# Patient Record
Sex: Female | Born: 1995 | Race: White | Hispanic: No | Marital: Single | Smoking: Former smoker
Health system: Southern US, Community
[De-identification: ages and names within clinical notes are randomized; demographics above are authoritative.]

## PROBLEM LIST (undated history)

## (undated) DIAGNOSIS — D6801 Von willebrand disease, type 1: Secondary | ICD-10-CM

## (undated) DIAGNOSIS — E039 Hypothyroidism, unspecified: Secondary | ICD-10-CM

## (undated) DIAGNOSIS — E109 Type 1 diabetes mellitus without complications: Secondary | ICD-10-CM

## (undated) DIAGNOSIS — D680 Von Willebrand's disease: Secondary | ICD-10-CM

## (undated) DIAGNOSIS — E119 Type 2 diabetes mellitus without complications: Secondary | ICD-10-CM

## (undated) HISTORY — PX: INCISION AND DRAINAGE FOOT: SHX1800

## (undated) HISTORY — PX: TONSILLECTOMY: SUR1361

---
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
## 2019-08-11 ENCOUNTER — Encounter (HOSPITAL_COMMUNITY): Payer: Self-pay | Admitting: *Deleted

## 2019-08-11 ENCOUNTER — Emergency Department (HOSPITAL_COMMUNITY)
Admission: EM | Admit: 2019-08-11 | Discharge: 2019-08-11 | Disposition: A | Discharge status: Home / Self Care | Payer: Self-pay | Attending: Emergency Medicine | Admitting: Emergency Medicine

## 2019-08-11 ENCOUNTER — Emergency Department (HOSPITAL_COMMUNITY): Payer: Self-pay

## 2019-08-11 ENCOUNTER — Other Ambulatory Visit: Payer: Self-pay

## 2019-08-11 DIAGNOSIS — D680 Von Willebrand's disease: Secondary | ICD-10-CM | POA: Insufficient documentation

## 2019-08-11 DIAGNOSIS — E1065 Type 1 diabetes mellitus with hyperglycemia: Secondary | ICD-10-CM | POA: Insufficient documentation

## 2019-08-11 DIAGNOSIS — R509 Fever, unspecified: Secondary | ICD-10-CM

## 2019-08-11 DIAGNOSIS — Z20828 Contact with and (suspected) exposure to other viral communicable diseases: Secondary | ICD-10-CM | POA: Insufficient documentation

## 2019-08-11 DIAGNOSIS — Z79899 Other long term (current) drug therapy: Secondary | ICD-10-CM | POA: Insufficient documentation

## 2019-08-11 DIAGNOSIS — R739 Hyperglycemia, unspecified: Secondary | ICD-10-CM

## 2019-08-11 DIAGNOSIS — E039 Hypothyroidism, unspecified: Secondary | ICD-10-CM | POA: Insufficient documentation

## 2019-08-11 LAB — COMPREHENSIVE METABOLIC PANEL
ALT: 20 U/L (ref 0–44)
AST: 19 U/L (ref 15–41)
Albumin: 4.2 g/dL (ref 3.5–5.0)
Alkaline Phosphatase: 108 U/L (ref 38–126)
Anion gap: 13 (ref 5–15)
BUN: 11 mg/dL (ref 6–20)
CO2: 24 mmol/L (ref 22–32)
Calcium: 9.3 mg/dL (ref 8.9–10.3)
Chloride: 98 mmol/L (ref 98–111)
Creatinine, Ser: 0.6 mg/dL (ref 0.44–1.00)
GFR calc Af Amer: >60 mL/min (ref >60)
GFR calc non Af Amer: >60 mL/min (ref >60)
Glucose, Bld: 347 mg/dL — ABNORMAL HIGH (ref 70–99)
Potassium: 3.7 mmol/L (ref 3.5–5.1)
Sodium: 135 mmol/L (ref 135–145)
Total Bilirubin: 0.6 mg/dL (ref 0.3–1.2)
Total Protein: 7.8 g/dL (ref 6.5–8.1)

## 2019-08-11 LAB — CBC WITH DIFFERENTIAL/PLATELET
Abs Immature Granulocytes: 0.07 10*3/uL (ref 0.00–0.07)
Basophils Absolute: 0.1 10*3/uL (ref 0.0–0.1)
Basophils Relative: 1 %
Eosinophils Absolute: 0.1 10*3/uL (ref 0.0–0.5)
Eosinophils Relative: 1 %
HCT: 47.4 % — ABNORMAL HIGH (ref 36.0–46.0)
Hemoglobin: 15.5 g/dL — ABNORMAL HIGH (ref 12.0–15.0)
Immature Granulocytes: 1 %
Lymphocytes Relative: 12 %
Lymphs Abs: 1.2 10*3/uL (ref 0.7–4.0)
MCH: 30.8 pg (ref 26.0–34.0)
MCHC: 32.7 g/dL (ref 30.0–36.0)
MCV: 94.2 fL (ref 80.0–100.0)
Monocytes Absolute: 0.9 10*3/uL (ref 0.1–1.0)
Monocytes Relative: 9 %
Neutro Abs: 7.5 10*3/uL (ref 1.7–7.7)
Neutrophils Relative %: 76 %
Platelets: 202 10*3/uL (ref 150–400)
RBC: 5.03 MIL/uL (ref 3.87–5.11)
RDW: 12.4 % (ref 11.5–15.5)
WBC: 9.7 10*3/uL (ref 4.0–10.5)
nRBC: 0 % (ref 0.0–0.2)

## 2019-08-11 LAB — URINALYSIS, ROUTINE W REFLEX MICROSCOPIC
Bacteria, UA: NONE SEEN
Bilirubin Urine: NEGATIVE
Glucose, UA: >=500 mg/dL — AB
Ketones, ur: 20 mg/dL — AB
Leukocytes,Ua: NEGATIVE
Nitrite: NEGATIVE
Protein, ur: NEGATIVE mg/dL
Specific Gravity, Urine: 1.031 — ABNORMAL HIGH (ref 1.005–1.030)
pH: 6 (ref 5.0–8.0)

## 2019-08-11 LAB — CBG MONITORING, ED: Glucose-Capillary: 286 mg/dL — ABNORMAL HIGH (ref 70–99)

## 2019-08-11 LAB — PREGNANCY, URINE: Preg Test, Ur: NEGATIVE

## 2019-08-11 LAB — LIPASE, BLOOD: Lipase: 15 U/L (ref 11–51)

## 2019-08-11 MED ORDER — INSULIN ASPART 100 UNIT/ML ~~LOC~~ SOLN
6.0000 [IU] | Freq: Once | SUBCUTANEOUS | Status: AC
  Administered 2019-08-11: 6 [IU] via SUBCUTANEOUS
  Filled 2019-08-11: qty 1

## 2019-08-11 MED ORDER — SODIUM CHLORIDE 0.9 % IV BOLUS (SEPSIS)
1000.0000 mL | Freq: Once | INTRAVENOUS | Status: AC
  Administered 2019-08-11: 20:00:00 1000 mL via INTRAVENOUS

## 2019-08-11 MED ORDER — SODIUM CHLORIDE 0.9 % IV BOLUS (SEPSIS): 1000.0000 mL | Freq: Once | INTRAVENOUS | Status: DC

## 2019-08-11 NOTE — Discharge Instructions (Signed)
Drink plenty of fluids and take your insulin as prescribed. Thank you for allowing me to care for you today. Please return to the emergency department if you have new or worsening symptoms. Take your medications as instructed.

## 2019-08-11 NOTE — ED Provider Notes (Signed)
[facility] Provider Note   CSN: [id number] Arrival date & time: 08/11/19  1901     History   Chief Complaint Chief Complaint  Patient presents with  . Fever    HPI Tammy Higgins is a 23 y.o. female.     Patient is a 23 year old female with past medical history of type 1 diabetes, hypothyroidism, von Willebrand's disease who presents emergency department for fever.  Patient reports that she began feeling bad yesterday.  Reports a fever, congestion, some upper abdominal pain.  Reports feeling nauseated and not being able to eat very much.  Reports one episode of vomiting a couple of days ago but she attributes this to drinking alcohol.  Denies any diarrhea, dysuria, cough, sick contacts.  She did take Tylenol about 1 hour ago.  Reports feeling slight headache as well.     Past Medical History:  Diagnosis Date  . Diabetes mellitus without complication (HCC)   . Hypothyroidism   . Von Willebrand disease, type I (HCC)     There are no active problems to display for this patient.   Past Surgical History:  Procedure Laterality Date  . INCISION AND DRAINAGE FOOT Left   . TONSILLECTOMY       OB History   No obstetric history on file.      Home Medications    Prior to Admission medications   Medication Sig Start Date End Date Taking? Authorizing Provider  acetaminophen (TYLENOL) 500 MG tablet Take 1,000 mg by mouth every 6 (six) hours as needed.    [provider]  insulin aspart (NOVOLOG) 100 UNIT/ML injection Inject 1-8 Units into the skin 3 (three) times daily before meals. Uses according to a sliding scale at home.    [provider]  insulin glargine (LANTUS) 100 UNIT/ML injection Inject 30 Units into the skin at bedtime.    [provider]  ondansetron (ZOFRAN) 4 MG tablet Take 1 tablet (4 mg total) by mouth every 6 (six) hours. 11/21/13   Ivery QualeBryant, Hobson, PA-C  oseltamivir (TAMIFLU) 75 MG capsule Take 1 capsule (75 mg  total) by mouth every 12 (twelve) hours. 11/21/13   Ivery QualeBryant, Hobson, PA-C  PRESCRIPTION MEDICATION Take 1 tablet by mouth daily. Birth Control    [provider]    Family History History reviewed. No pertinent family history.  Social History Social History   Tobacco Use  . Smoking status: Never Smoker  . Smokeless tobacco: Never Used  Substance Use Topics  . Alcohol use: Yes    Comment: occasionally  . Drug use: Yes    Types: Marijuana     Allergies   Patient has no known allergies.   Review of Systems Review of Systems  Constitutional: Positive for fatigue and fever. Negative for activity change, appetite change, chills, diaphoresis and unexpected weight change.  HENT: Positive for congestion. Negative for rhinorrhea, sinus pain, sore throat and trouble swallowing.   Eyes: Positive for redness. Negative for photophobia, pain, discharge, itching and visual disturbance.  Respiratory: Negative for cough and shortness of breath.   Cardiovascular: Negative for chest pain, palpitations and leg swelling.  Gastrointestinal: Positive for nausea and vomiting. Negative for abdominal distention, abdominal pain, blood in stool, constipation and diarrhea.  Genitourinary: Negative for decreased urine volume, dysuria, flank pain, hematuria and vaginal discharge.  Musculoskeletal: Negative for arthralgias, back pain, myalgias, neck pain and neck stiffness.  Skin: Negative for rash.  Neurological: Positive for headaches. Negative for dizziness, weakness and light-headedness.  Physical Exam Updated Vital Signs BP 127/85 (BP Location: Right Arm)   Pulse (!) 121   Temp 98.7 F (37.1 C) (Oral)   Resp 18   Ht 5\' 3"  (1.6 m)   Wt 49.9 kg   LMP 08/11/2019   SpO2 98%   BMI 19.49 kg/m   Physical Exam Vitals signs and nursing note reviewed.  Constitutional:      Appearance: Normal appearance.  HENT:     Head: Normocephalic and atraumatic.     Mouth/Throat:     Mouth: Mucous  membranes are moist.  Eyes:     Pupils: Pupils are equal, round, and reactive to light.     Comments: Mildly injected conjunctiva bilaterally  Cardiovascular:     Rate and Rhythm: Regular rhythm. Tachycardia present.  Pulmonary:     Effort: Pulmonary effort is normal. No respiratory distress.     Breath sounds: Normal breath sounds. No wheezing, rhonchi or rales.  Chest:     Chest wall: No tenderness.  Abdominal:     General: Abdomen is flat. Bowel sounds are normal. There is no distension.     Palpations: Abdomen is soft.     Tenderness: There is no right CVA tenderness, left CVA tenderness or guarding.  Musculoskeletal: Normal range of motion.  Skin:    General: Skin is warm and dry.  Neurological:     General: No focal deficit present.     Mental Status: She is alert.  Psychiatric:        Mood and Affect: Mood normal.      ED Treatments / Results  Labs (all labs ordered are listed, but only abnormal results are displayed) Labs Reviewed  COMPREHENSIVE METABOLIC PANEL - Abnormal; Notable for the following components:      Result Value   Glucose, Bld 347 (*)    All other components within normal limits  CBC WITH DIFFERENTIAL/PLATELET - Abnormal; Notable for the following components:   Hemoglobin 15.5 (*)    HCT 47.4 (*)    All other components within normal limits  URINALYSIS, ROUTINE W REFLEX MICROSCOPIC - Abnormal; Notable for the following components:   Specific Gravity, Urine 1.031 (*)    Glucose, UA >=500 (*)    Hgb urine dipstick LARGE (*)    Ketones, ur 20 (*)    All other components within normal limits  CBG MONITORING, ED - Abnormal; Notable for the following components:   Glucose-Capillary 286 (*)    All other components within normal limits  NOVEL CORONAVIRUS, NAA (HOSP ORDER, SEND-OUT TO REF LAB; TAT 18-24 HRS)  LIPASE, BLOOD  PREGNANCY, URINE    EKG None  Radiology Dg Chest Port 1 View  Result Date: 08/11/2019 CLINICAL DATA:  Cough and fever  EXAM: PORTABLE CHEST 1 VIEW COMPARISON:  October 05, 2018 FINDINGS: There is no edema or consolidation. Heart size and pulmonary vascularity are normal. No adenopathy. No bone lesions. IMPRESSION: No edema or consolidation. Electronically Signed   By: Lowella Grip III M.D.   On: 08/11/2019 20:31    Procedures Procedures (including critical care time)  Medications Ordered in ED Medications  insulin aspart (novoLOG) injection 6 Units (has no administration in time range)  sodium chloride 0.9 % bolus 1,000 mL (has no administration in time range)  sodium chloride 0.9 % bolus 1,000 mL (1,000 mLs Intravenous New Bag/Given 08/11/19 2018)     Initial Impression / Assessment and Plan / ED Course  I have reviewed the triage vital signs and  the nursing notes.  Pertinent labs & imaging results that were available during my care of the patient were reviewed by me and considered in my medical decision making (see chart for details).  Clinical Course as of Aug 10 2149  Mon Aug 11, 2019  2110 Type I diabetic presenting with 2 days of anorexia, fever, congestion.  Has not taken her insulin today.  Denies any nausea, vomiting, abdominal pain, dysuria.  On work-up she appears to be dehydrated with large amounts of glucose and 20 ketones in her urine. Hgb urine Is explained by her current menstrual cycle. Anion gap is normal.  The remainder of her labs are reassuring and she was afebrile here today.  Does not appear to be in DKA.  Her blood sugar was elevated to 347 and therefore 2 L of fluid and 6 units of NovoLog are given.  Patient is feeling much better after 1 L of fluids and her glucose was decreased to 286.  She declined any insulin or further fluids.  Tolerating p.o. and asks to go home.  I will treat this as suspected COVID and order a send out COVID swab.  Advised on quarantine at home and return precautions.   [KM]    Clinical Course User Index [KM] Arlyn Dunning, PA-C       Based on  review of vitals, medical screening exam, lab work and/or imaging, there does not appear to be an acute, emergent etiology for the patient's symptoms. Counseled pt on good return precautions and encouraged both PCP and ED follow-up as needed.  Prior to discharge, I also discussed incidental imaging findings with patient in detail and advised appropriate, recommended follow-up in detail.  Clinical Impression: 1. Hyperglycemia   2. Fever   3. Fever, unspecified fever cause     Disposition: Discharge  Prior to providing a prescription for a controlled substance, I independently reviewed the patient's recent prescription history on the [place] Controlled Substance Reporting System. The patient had no recent or regular prescriptions and was deemed appropriate for a brief, less than 3 day prescription of narcotic for acute analgesia.  This note was prepared with assistance of Conservation officer, historic buildings. Occasional wrong-word or sound-a-like substitutions may have occurred due to the inherent limitations of voice recognition software.   Final Clinical Impressions(s) / ED Diagnoses   Final diagnoses:  Hyperglycemia  Fever, unspecified fever cause    ED Discharge Orders    None       Jeral Pinch 08/11/19 2150    Bethann Berkshire, MD 08/14/19 1049

## 2019-08-11 NOTE — ED Triage Notes (Signed)
Pt states she started running a fever of 104 today and has been feeling sob and unable to keep any food or fluids down; pt took tylenol one hour pta

## 2019-08-13 LAB — NOVEL CORONAVIRUS, NAA (HOSP ORDER, SEND-OUT TO REF LAB; TAT 18-24 HRS): SARS-CoV-2, NAA: NOT DETECTED

## 2020-02-27 ENCOUNTER — Emergency Department (HOSPITAL_COMMUNITY)
Admission: EM | Admit: 2020-02-27 | Discharge: 2020-02-27 | Disposition: A | Discharge status: Home / Self Care | Payer: Medicaid Other | Attending: Emergency Medicine | Admitting: Emergency Medicine

## 2020-02-27 ENCOUNTER — Encounter (HOSPITAL_COMMUNITY): Payer: Self-pay | Admitting: *Deleted

## 2020-02-27 ENCOUNTER — Other Ambulatory Visit: Payer: Self-pay

## 2020-02-27 DIAGNOSIS — E1065 Type 1 diabetes mellitus with hyperglycemia: Secondary | ICD-10-CM | POA: Insufficient documentation

## 2020-02-27 DIAGNOSIS — K529 Noninfective gastroenteritis and colitis, unspecified: Secondary | ICD-10-CM | POA: Insufficient documentation

## 2020-02-27 DIAGNOSIS — R739 Hyperglycemia, unspecified: Secondary | ICD-10-CM

## 2020-02-27 DIAGNOSIS — A084 Viral intestinal infection, unspecified: Secondary | ICD-10-CM

## 2020-02-27 DIAGNOSIS — Z793 Long term (current) use of hormonal contraceptives: Secondary | ICD-10-CM | POA: Insufficient documentation

## 2020-02-27 DIAGNOSIS — R112 Nausea with vomiting, unspecified: Secondary | ICD-10-CM

## 2020-02-27 DIAGNOSIS — Z79899 Other long term (current) drug therapy: Secondary | ICD-10-CM | POA: Insufficient documentation

## 2020-02-27 DIAGNOSIS — N3001 Acute cystitis with hematuria: Secondary | ICD-10-CM

## 2020-02-27 DIAGNOSIS — Z20822 Contact with and (suspected) exposure to covid-19: Secondary | ICD-10-CM | POA: Insufficient documentation

## 2020-02-27 DIAGNOSIS — R197 Diarrhea, unspecified: Secondary | ICD-10-CM

## 2020-02-27 DIAGNOSIS — E039 Hypothyroidism, unspecified: Secondary | ICD-10-CM | POA: Insufficient documentation

## 2020-02-27 LAB — COMPREHENSIVE METABOLIC PANEL
ALT: 21 U/L (ref 0–44)
AST: 24 U/L (ref 15–41)
Albumin: 4.5 g/dL (ref 3.5–5.0)
Alkaline Phosphatase: 80 U/L (ref 38–126)
Anion gap: 12 (ref 5–15)
BUN: 14 mg/dL (ref 6–20)
CO2: 25 mmol/L (ref 22–32)
Calcium: 9.4 mg/dL (ref 8.9–10.3)
Chloride: 101 mmol/L (ref 98–111)
Creatinine, Ser: 0.65 mg/dL (ref 0.44–1.00)
GFR calc Af Amer: >60 mL/min (ref >60)
GFR calc non Af Amer: >60 mL/min (ref >60)
Glucose, Bld: 274 mg/dL — ABNORMAL HIGH (ref 70–99)
Potassium: 3.4 mmol/L — ABNORMAL LOW (ref 3.5–5.1)
Sodium: 138 mmol/L (ref 135–145)
Total Bilirubin: 0.5 mg/dL (ref 0.3–1.2)
Total Protein: 7.7 g/dL (ref 6.5–8.1)

## 2020-02-27 LAB — URINALYSIS, ROUTINE W REFLEX MICROSCOPIC
Bilirubin Urine: NEGATIVE
Glucose, UA: >=500 mg/dL — AB
Ketones, ur: NEGATIVE mg/dL
Nitrite: NEGATIVE
Protein, ur: 30 mg/dL — AB
RBC / HPF: >50 RBC/hpf — ABNORMAL HIGH (ref 0–5)
Specific Gravity, Urine: 1.015 (ref 1.005–1.030)
pH: 8 (ref 5.0–8.0)

## 2020-02-27 LAB — CBC WITH DIFFERENTIAL/PLATELET
Abs Immature Granulocytes: 0.07 10*3/uL (ref 0.00–0.07)
Basophils Absolute: 0.1 10*3/uL (ref 0.0–0.1)
Basophils Relative: 0 %
Eosinophils Absolute: 0 10*3/uL (ref 0.0–0.5)
Eosinophils Relative: 0 %
HCT: 46.5 % — ABNORMAL HIGH (ref 36.0–46.0)
Hemoglobin: 15.5 g/dL — ABNORMAL HIGH (ref 12.0–15.0)
Immature Granulocytes: 0 %
Lymphocytes Relative: 8 %
Lymphs Abs: 1.5 10*3/uL (ref 0.7–4.0)
MCH: 30.8 pg (ref 26.0–34.0)
MCHC: 33.3 g/dL (ref 30.0–36.0)
MCV: 92.3 fL (ref 80.0–100.0)
Monocytes Absolute: 1 10*3/uL (ref 0.1–1.0)
Monocytes Relative: 6 %
Neutro Abs: 14.8 10*3/uL — ABNORMAL HIGH (ref 1.7–7.7)
Neutrophils Relative %: 86 %
Platelets: 252 10*3/uL (ref 150–400)
RBC: 5.04 MIL/uL (ref 3.87–5.11)
RDW: 12.3 % (ref 11.5–15.5)
WBC: 17.4 10*3/uL — ABNORMAL HIGH (ref 4.0–10.5)
nRBC: 0 % (ref 0.0–0.2)

## 2020-02-27 LAB — RESPIRATORY PANEL BY RT PCR (FLU A&B, COVID)
Influenza A by PCR: NEGATIVE
Influenza B by PCR: NEGATIVE
SARS Coronavirus 2 by RT PCR: NEGATIVE

## 2020-02-27 LAB — CBG MONITORING, ED
Glucose-Capillary: 254 mg/dL — ABNORMAL HIGH (ref 70–99)
Glucose-Capillary: 140 mg/dL — ABNORMAL HIGH (ref 70–99)

## 2020-02-27 LAB — HCG, SERUM, QUALITATIVE: Preg, Serum: NEGATIVE

## 2020-02-27 LAB — TSH: TSH: 3.446 u[IU]/mL (ref 0.350–4.500)

## 2020-02-27 LAB — BETA-HYDROXYBUTYRIC ACID: Beta-Hydroxybutyric Acid: 0.15 mmol/L (ref 0.05–0.27)

## 2020-02-27 LAB — LIPASE, BLOOD: Lipase: 16 U/L (ref 11–51)

## 2020-02-27 MED ORDER — ONDANSETRON HCL 4 MG/2ML IJ SOLN
4.0000 mg | Freq: Once | INTRAMUSCULAR | Status: AC
  Administered 2020-02-27: 15:00:00 4 mg via INTRAVENOUS
  Filled 2020-02-27: qty 2

## 2020-02-27 MED ORDER — PROMETHAZINE HCL 25 MG/ML IJ SOLN
12.5000 mg | Freq: Once | INTRAMUSCULAR | Status: AC
  Administered 2020-02-27: 17:00:00 12.5 mg via INTRAVENOUS
  Filled 2020-02-27: qty 1

## 2020-02-27 MED ORDER — SODIUM CHLORIDE 0.9 % IV BOLUS
1000.0000 mL | Freq: Once | INTRAVENOUS | Status: AC
  Administered 2020-02-27: 15:00:00 1000 mL via INTRAVENOUS

## 2020-02-27 MED ORDER — LACTATED RINGERS IV BOLUS
1000.0000 mL | Freq: Once | INTRAVENOUS | Status: AC
  Administered 2020-02-27: 17:00:00 1000 mL via INTRAVENOUS

## 2020-02-27 MED ORDER — CEPHALEXIN 500 MG PO CAPS: 500.0000 mg | ORAL_CAPSULE | Freq: Two times a day (BID) | ORAL | 0 refills | Status: AC

## 2020-02-27 MED ORDER — PROMETHAZINE HCL 25 MG PO TABS: 25.0000 mg | ORAL_TABLET | Freq: Three times a day (TID) | ORAL | 0 refills | Status: DC | PRN

## 2020-02-27 NOTE — ED Provider Notes (Signed)
Care handoff received from [facility] PA-C at shift change. Please see previous note for full details.  In short 24 year old type I diabetic presents today with nausea vomiting and diarrhea that began this morning.  Labs obtained by previous team are very reassuring, patient does not appear to be in DKA.  She was given normal saline bolus as well as lactated Ringer's as well as 4 mg IV Zofran x2.  Plan of care at shift change is to follow-up on remaining labs, p.o. challenge and anticipate discharge. Physical Exam  BP 115/77 (BP Location: Right Arm)   Pulse 90   Temp 97.6 F (36.4 C)   Resp 18   Ht 5\' 2"  (1.575 m)   Wt 54.4 kg   LMP 02/27/2020   SpO2 99%   BMI 21.95 kg/m   Physical Exam Constitutional:      General: She is not in acute distress.    Appearance: Normal appearance. She is well-developed. She is not ill-appearing or diaphoretic.  HENT:     Head: Normocephalic and atraumatic.     Right Ear: External ear normal.     Left Ear: External ear normal.     Nose: Nose normal.  Eyes:     General: Vision grossly intact. Gaze aligned appropriately.     Pupils: Pupils are equal, round, and reactive to light.  Neck:     Trachea: Trachea and phonation normal. No tracheal deviation.  Pulmonary:     Effort: Pulmonary effort is normal. No respiratory distress.  Abdominal:     General: There is no distension.     Palpations: Abdomen is soft.     Tenderness: There is no abdominal tenderness. There is no guarding or rebound.  Musculoskeletal:        General: Normal range of motion.     Cervical back: Normal range of motion.  Skin:    General: Skin is warm and dry.  Neurological:     Mental Status: She is alert.     GCS: GCS eye subscore is 4. GCS verbal subscore is 5. GCS motor subscore is 6.     Comments: Speech is clear and goal oriented, follows commands Major Cranial nerves without deficit, no facial droop Moves extremities without ataxia, coordination intact   Psychiatric:        Behavior: Behavior normal.     ED Course/Procedures   Clinical Course as of Feb 26 1725  Fri Feb 27, 2020  1419 Lipase within normal limits  Lipase, blood [WF]  1509 Very mild hypokalemia at 3.4 likely secondary to GI losses.  Glucose 274 consistent with mild DKA.  Comprehensive metabolic panel(!) [WF]  1509 CBC notable for leukocytosis likely secondary to demyelinization secondary to vomiting.  Patient does appear to be dehydrated with hemoconcentration and elevated hemoglobin.  She is currently on her period low suspicion for.  To be symptomatically anemic given her elevated/normal hemoglobin.  CBC with Differential/Platelet(!) [WF]    Clinical Course User Index [WF] 1510, Gailen Shelter    Procedures  MDM  I have reviewed and interpreted labs.  Covid/flu panel is negative.  CMP showed mild hypokalemia at 3.4, glucose 274.  No emergent electrolyte abnormalities, elevation of LFTs or evidence of kidney injury.  No evidence of DKA on CMP.  Beta hydroxybutyric acid is within normal limits also supportive that patient is not currently in DKA.  Lipase within normal limits no evidence of pancreatitis.  CBC shows a nonspecific leukocytosis of 17.4, no evidence of  anemia.  CBC is suggestive of possible hemoconcentration however leukocytosis may also be secondary to patient's recurrent vomiting today likely secondary to gastroenteritis.  Repeat CBG improved at 140.  Pregnancy test was negative.  TSH is within normal limits.  Urinalysis is negative for ketones also supportive that patient is not in DKA, it does show glucose, hemoglobin, protein, leukocytes, RBCs, WBCs and rare bacteria, nitrite negative.  Patient does not have urinary symptoms, she is currently menstruating which may be the source of abnormalities on UA however given patient's white count and nausea/vomiting we will treat with Keflex 500 mg twice daily x5 days and send urine for culture.  Patient was observed in  the ER, she endorsed continued nausea after her second dose of Zofran, she was then given 12.5 mg Phenergan and reports resolution of her nausea.  She is now tolerating p.o. in the ER and is requesting discharge.  On reassessment she is sitting upright in bed comfortable no acute distress, mother at bedside.  They state understanding of diagnosis and are agreeable with discharge and PCP follow-up.  No evidence of cholecystitis, appendicitis, perforation, SBO or other emergent pathologies requiring imaging at this time. VSS.  At this time there does not appear to be any evidence of an acute emergency medical condition and the patient appears stable for discharge with appropriate outpatient follow up. Diagnosis was discussed with patient who verbalizes understanding of care plan and is agreeable to discharge. I have discussed return precautions with patient and mother who verbalizes understanding of return precautions. Patient encouraged to follow-up with their PCP. All questions answered.  Patient's case discussed with Dr. Ashok Cordia who agrees with plan to discharge with Keflex, phenergan and PCP follow-up.    Note: Portions of this report may have been transcribed using voice recognition software. Every effort was made to ensure accuracy; however, inadvertent computerized transcription errors may still be present.   Deliah Boston, PA-C 02/27/20 1859    Lajean Saver, MD 02/27/20 2350

## 2020-02-27 NOTE — ED Notes (Signed)
Pt was informed that we need a urine sample. 

## 2020-02-27 NOTE — ED Triage Notes (Signed)
C/o elevated blood sugar, vomiting  today

## 2020-02-27 NOTE — ED Provider Notes (Signed)
Ultrasound ED Peripheral IV (Provider)  Date/Time: 02/27/2020 2:17 PM Performed by: Gailen Shelter, PA Authorized by: Gailen Shelter, PA   Procedure details:    Indications: hydration     Skin Prep: chlorhexidine gluconate     Location:  Left AC   Angiocath:  20 G   Bedside Ultrasound Guided: No     Images: not archived     Patient tolerated procedure without complications: Yes     Dressing applied: Yes        Gailen Shelter, PA 02/27/20 1417    Bethann Berkshire, MD 02/27/20 1542

## 2020-02-27 NOTE — ED Provider Notes (Signed)
[facility] EMERGENCY DEPARTMENT Provider Note   CSN: [id number] Arrival date & time: 02/27/20  1259     History Chief Complaint  Patient presents with  . Hyperglycemia    Tammy Higgins is a 24 y.o. female   HPI  Patient is a 24 year old female with a history of type 1 diabetes without complications and hypothyroidism envelopment disease presented today with nausea, vomiting, abdominal pain, diarrhea, fatigue.  Patient is currently on her period and states that she has a history of malignant disease and has been having her normal heavy periods. She states that yesterday she was feeling fine apart from some small cramps but states that this morning she woke up feeling nauseous, vomiting too numerous times to count that was nonbloody nonbilious, had several episodes of loose stool was nonbloody and nondark or tarry. She is brought to ED by mother who is at bedside.  Mother states that patient has had a attached glucometer to her which provided some "crazy numbers" which ranged from 50-500. Patient's mother states that when they correlated these numbers with a fingerstick glucometer which they found that they were not accurate. There is some uncertainty as to whether these numbers may have prompted over/under treatment of patient's blood sugar.  Patient and mother denies any fevers, patient endorses some chest pressure and abdominal pain. Denies any focal tenderness of the abdomen or pain in the abdomen.      Past Medical History:  Diagnosis Date  . Diabetes mellitus without complication (HCC)   . Hypothyroidism   . Von Willebrand disease, type I (HCC)     There are no problems to display for this patient.   Past Surgical History:  Procedure Laterality Date  . INCISION AND DRAINAGE FOOT Left   . TONSILLECTOMY       OB History   No obstetric history on file.     No family history on file.  Social History   Tobacco Use  . Smoking status: Never Smoker  . Smokeless  tobacco: Never Used  Substance Use Topics  . Alcohol use: Yes    Comment: occasionally  . Drug use: Yes    Types: Marijuana    Home Medications Prior to Admission medications   Medication Sig Start Date End Date Taking? Authorizing Provider  acetaminophen (TYLENOL) 500 MG tablet Take 1,000 mg by mouth every 6 (six) hours as needed.    [provider]  insulin aspart (NOVOLOG) 100 UNIT/ML injection Inject 1-8 Units into the skin 3 (three) times daily before meals. Uses according to a sliding scale at home.    [provider]  insulin glargine (LANTUS) 100 UNIT/ML injection Inject 30 Units into the skin at bedtime.    [provider]  ondansetron (ZOFRAN) 4 MG tablet Take 1 tablet (4 mg total) by mouth every 6 (six) hours. 11/21/13   Ivery Quale, PA-C  oseltamivir (TAMIFLU) 75 MG capsule Take 1 capsule (75 mg total) by mouth every 12 (twelve) hours. 11/21/13   Ivery Quale, PA-C  PRESCRIPTION MEDICATION Take 1 tablet by mouth daily. Birth Control    [provider]    Allergies    Patient has no known allergies.  Review of Systems   Review of Systems  Constitutional: Positive for fatigue. Negative for chills and fever.  HENT: Negative for congestion.   Eyes: Negative for pain.  Respiratory: Positive for chest tightness. Negative for cough and shortness of breath.   Cardiovascular: Negative for chest pain and leg swelling.  Gastrointestinal: Positive for abdominal pain, diarrhea, nausea and vomiting.  Genitourinary: Negative for dysuria, flank pain, frequency, hematuria, pelvic pain, urgency, vaginal bleeding, vaginal discharge and vaginal pain.  Musculoskeletal: Negative for myalgias.  Skin: Negative for rash.  Neurological: Negative for dizziness and headaches.    Physical Exam Updated Vital Signs BP 115/77 (BP Location: Right Arm)   Pulse 90   Temp 97.6 F (36.4 C)   Resp 18   Ht 5\' 2"  (1.575 m)   Wt 54.4 kg   LMP 02/27/2020   SpO2  99%   BMI 21.95 kg/m   Physical Exam Vitals and nursing note reviewed.  Constitutional:      General: She is not in acute distress.    Appearance: She is ill-appearing.  HENT:     Head: Normocephalic and atraumatic.     Nose: Nose normal.     Mouth/Throat:     Mouth: Mucous membranes are dry.  Eyes:     General: No scleral icterus. Cardiovascular:     Rate and Rhythm: Normal rate and regular rhythm.     Pulses: Normal pulses.     Heart sounds: Normal heart sounds.  Pulmonary:     Effort: Pulmonary effort is normal. No respiratory distress.     Breath sounds: No wheezing.  Abdominal:     Palpations: Abdomen is soft.     Tenderness: There is no abdominal tenderness. There is no right CVA tenderness, left CVA tenderness, guarding or rebound.     Comments: No tenderness to palpation of abdomen.  No rebound or guarding.  Negative McBurney, Murphy, Rovsing, psoas  Musculoskeletal:     Cervical back: Normal range of motion.     Right lower leg: No edema.     Left lower leg: No edema.  Skin:    General: Skin is warm and dry.     Capillary Refill: Capillary refill takes less than 2 seconds.  Neurological:     Mental Status: She is alert and oriented to person, place, and time. Mental status is at baseline.  Psychiatric:        Mood and Affect: Mood normal.        Behavior: Behavior normal.     ED Results / Procedures / Treatments   Labs (all labs ordered are listed, but only abnormal results are displayed) Labs Reviewed  CBC WITH DIFFERENTIAL/PLATELET - Abnormal; Notable for the following components:      Result Value   WBC 17.4 (*)    Hemoglobin 15.5 (*)    HCT 46.5 (*)    Neutro Abs 14.8 (*)    All other components within normal limits  COMPREHENSIVE METABOLIC PANEL - Abnormal; Notable for the following components:   Potassium 3.4 (*)    Glucose, Bld 274 (*)    All other components within normal limits  CBG MONITORING, ED - Abnormal; Notable for the following  components:   Glucose-Capillary 254 (*)    All other components within normal limits  RESPIRATORY PANEL BY RT PCR (FLU A&B, COVID)  LIPASE, BLOOD  BETA-HYDROXYBUTYRIC ACID  TSH  HCG, SERUM, QUALITATIVE  URINALYSIS, ROUTINE W REFLEX MICROSCOPIC  PREGNANCY, URINE  T4, FREE  CBG MONITORING, ED    EKG None  Radiology No results found.  Procedures Procedures (including critical care time)  Medications Ordered in ED Medications  lactated ringers bolus 1,000 mL (has no administration in time range)  sodium chloride 0.9 % bolus 1,000 mL (0 mLs Intravenous Stopped 02/27/20 1534)  ondansetron (  ZOFRAN) injection 4 mg (4 mg Intravenous Given 02/27/20 1433)  ondansetron (ZOFRAN) injection 4 mg (4 mg Intravenous Given 02/27/20 1520)    ED Course  I have reviewed the triage vital signs and the nursing notes.  Pertinent labs & imaging results that were available during my care of the patient were reviewed by me and considered in my medical decision making (see chart for details).  Patient is 24 year old female with a history of type 1 diabetes, hypothyroidism which has been untreated due to mild nature, von Willebrand's disease.  Nausea vomiting and diarrhea that began this morning.  No fevers, dysuria, frequency urgency.   Clinical Course as of Feb 27 1635  Fri Feb 27, 2020  1419 Lipase within normal limits  Lipase, blood [WF]  1509 Very mild hypokalemia at 3.4 likely secondary to GI losses.  Glucose 274 consistent with mild DKA.  Comprehensive metabolic panel(!) [WF]  3428 CBC notable for leukocytosis likely secondary to demyelinization secondary to vomiting.  Patient does appear to be dehydrated with hemoconcentration and elevated hemoglobin.  She is currently on her period low suspicion for.  To be symptomatically anemic given her elevated/normal hemoglobin.  CBC with Differential/Platelet(!) [WF]    Clinical Course User Index [WF] Tedd Sias, [place]   After first bag of fluid  patient has been unable to urinate.  hCG serum qualitative pregnancy test ordered and is negative.  Covid PCR negative.  This was ordered by Dr. Roderic Palau to assess for possibility of infection.  Beta hydroxybutyrate is within normal limits making DKA very unlikely.  TSH is within normal limits.  Suspect that this presentation is viral gastroenteritis.  She has leukocytosis consistent with the marginalization from vomiting.  Patient care transferred to [facility] at time of shift change.  Pending urinalysis, repeat CBG, reassessments.  Anticipate discharge with symptomatic treatment of Zofran and any other treatments that oncoming provider deems reasonable.  On my reassessment patient feels somewhat improved.  She is given a second dose of Zofran as she is continually nauseous.  She is receiving a bag of LR as she is slightly hyperkalemic.  CBG will be repeated. No insulin given at this time as she has mild hyperglycemia w/ no anion gap or BHB elevation.   MDM Rules/Calculators/A&P                      Final Clinical Impression(s) / ED Diagnoses Final diagnoses:  Hyperglycemia  Nausea vomiting and diarrhea    Rx / DC Orders ED Discharge Orders    None       Tedd Sias, [place] 02/27/20 1637    Milton Ferguson, MD 02/28/20 [phone]

## 2020-02-27 NOTE — Discharge Instructions (Addendum)
You have been diagnosed today with hyperglycemia, nausea, vomiting, diarrhea, viral gastroenteritis, acute cystitis with hematuria.  At this time there does not appear to be the presence of an emergent medical condition, however there is always the potential for conditions to change. Please read and follow the below instructions.  Please return to the Emergency Department immediately for any new or worsening symptoms or if your symptoms do not improve within 3 days. Please be sure to follow up with your Primary Care Provider within one week regarding your visit today; please call their office to schedule an appointment even if you are feeling better for a follow-up visit. You may take the nausea medication Phenergan as prescribed to help with your symptoms. Please take the antibiotic Keflex as prescribed to treat possible urinary tract infection.  Your urine was sent for culture if it grows out bacteria that require treatment with a different antibiotic you will be contacted with a new prescription by [facility]. Please drink plenty of water to avoid dehydration and get plenty of rest.  Get help right away if: You have chest pain. You feel very weak. You pass out (faint). You see blood in your throw-up. Your throw-up looks like coffee grounds. You have bloody or black poop (stools) or poop that looks like tar. You have a very bad headache, or a stiff neck, or both. You have a rash. You have very bad pain, cramping, or bloating in your belly. You have trouble breathing. You are breathing very quickly. You have a fast heartbeat. Your skin feels cold and clammy. You feel mixed up (confused). You have pain when you pee. You have signs of not having enough water in the body, such as: Dark pee, hardly any pee, or no pee. Cracked lips. Dry mouth. Sunken eyes. Feeling very sleepy. Feeling weak. You have any new/concerning or worsening of symptoms  Please read the additional information  packets attached to your discharge summary.  Do not take your medicine if  develop an itchy rash, swelling in your mouth or lips, or difficulty breathing; call 911 and seek immediate emergency medical attention if this occurs.  Note: Portions of this text may have been transcribed using voice recognition software. Every effort was made to ensure accuracy; however, inadvertent computerized transcription errors may still be present.

## 2020-02-28 LAB — T4, FREE: Free T4: 0.74 ng/dL (ref 0.61–1.12)

## 2020-02-29 ENCOUNTER — Encounter (HOSPITAL_COMMUNITY): Payer: Self-pay | Admitting: Emergency Medicine

## 2020-02-29 ENCOUNTER — Other Ambulatory Visit: Payer: Self-pay

## 2020-02-29 ENCOUNTER — Emergency Department (HOSPITAL_COMMUNITY): Payer: Medicaid Other

## 2020-02-29 ENCOUNTER — Observation Stay (HOSPITAL_COMMUNITY)
Admission: EM | Admit: 2020-02-29 | Discharge: 2020-03-01 | Disposition: A | Discharge status: Home / Self Care | Payer: Medicaid Other | Attending: Family Medicine | Admitting: Family Medicine

## 2020-02-29 DIAGNOSIS — R1115 Cyclical vomiting syndrome unrelated to migraine: Secondary | ICD-10-CM

## 2020-02-29 DIAGNOSIS — D6800 Von Willebrand disease, unspecified: Secondary | ICD-10-CM | POA: Diagnosis present

## 2020-02-29 DIAGNOSIS — N390 Urinary tract infection, site not specified: Secondary | ICD-10-CM | POA: Diagnosis present

## 2020-02-29 DIAGNOSIS — R112 Nausea with vomiting, unspecified: Secondary | ICD-10-CM

## 2020-02-29 DIAGNOSIS — R110 Nausea: Secondary | ICD-10-CM | POA: Insufficient documentation

## 2020-02-29 DIAGNOSIS — F1210 Cannabis abuse, uncomplicated: Secondary | ICD-10-CM | POA: Diagnosis present

## 2020-02-29 DIAGNOSIS — D6801 Von willebrand disease, type 1: Secondary | ICD-10-CM

## 2020-02-29 DIAGNOSIS — N12 Tubulo-interstitial nephritis, not specified as acute or chronic: Principal | ICD-10-CM | POA: Insufficient documentation

## 2020-02-29 DIAGNOSIS — F12188 Cannabis abuse with other cannabis-induced disorder: Secondary | ICD-10-CM | POA: Diagnosis present

## 2020-02-29 DIAGNOSIS — E1010 Type 1 diabetes mellitus with ketoacidosis without coma: Secondary | ICD-10-CM | POA: Diagnosis present

## 2020-02-29 DIAGNOSIS — E109 Type 1 diabetes mellitus without complications: Secondary | ICD-10-CM | POA: Insufficient documentation

## 2020-02-29 DIAGNOSIS — K920 Hematemesis: Secondary | ICD-10-CM

## 2020-02-29 DIAGNOSIS — K529 Noninfective gastroenteritis and colitis, unspecified: Secondary | ICD-10-CM | POA: Diagnosis present

## 2020-02-29 DIAGNOSIS — E039 Hypothyroidism, unspecified: Secondary | ICD-10-CM | POA: Insufficient documentation

## 2020-02-29 DIAGNOSIS — E1065 Type 1 diabetes mellitus with hyperglycemia: Secondary | ICD-10-CM

## 2020-02-29 DIAGNOSIS — Z20822 Contact with and (suspected) exposure to covid-19: Secondary | ICD-10-CM | POA: Insufficient documentation

## 2020-02-29 DIAGNOSIS — R197 Diarrhea, unspecified: Secondary | ICD-10-CM | POA: Insufficient documentation

## 2020-02-29 LAB — URINALYSIS, ROUTINE W REFLEX MICROSCOPIC
Bilirubin Urine: NEGATIVE
Glucose, UA: >=500 mg/dL — AB
Ketones, ur: 20 mg/dL — AB
Nitrite: NEGATIVE
Protein, ur: 30 mg/dL — AB
Specific Gravity, Urine: 1.017 (ref 1.005–1.030)
pH: 6 (ref 5.0–8.0)

## 2020-02-29 LAB — CBC WITH DIFFERENTIAL/PLATELET
Abs Immature Granulocytes: 0.05 10*3/uL (ref 0.00–0.07)
Basophils Absolute: 0.1 10*3/uL (ref 0.0–0.1)
Basophils Relative: 1 %
Eosinophils Absolute: 0 10*3/uL (ref 0.0–0.5)
Eosinophils Relative: 0 %
HCT: 42 % (ref 36.0–46.0)
Hemoglobin: 14 g/dL (ref 12.0–15.0)
Immature Granulocytes: 1 %
Lymphocytes Relative: 13 %
Lymphs Abs: 1.4 10*3/uL (ref 0.7–4.0)
MCH: 30.9 pg (ref 26.0–34.0)
MCHC: 33.3 g/dL (ref 30.0–36.0)
MCV: 92.7 fL (ref 80.0–100.0)
Monocytes Absolute: 0.5 10*3/uL (ref 0.1–1.0)
Monocytes Relative: 5 %
Neutro Abs: 8.4 10*3/uL — ABNORMAL HIGH (ref 1.7–7.7)
Neutrophils Relative %: 80 %
Platelets: 203 10*3/uL (ref 150–400)
RBC: 4.53 MIL/uL (ref 3.87–5.11)
RDW: 12.3 % (ref 11.5–15.5)
WBC: 10.4 10*3/uL (ref 4.0–10.5)
nRBC: 0 % (ref 0.0–0.2)

## 2020-02-29 LAB — COMPREHENSIVE METABOLIC PANEL WITH GFR
ALT: 20 U/L (ref 0–44)
AST: 20 U/L (ref 15–41)
Albumin: 4.2 g/dL (ref 3.5–5.0)
Alkaline Phosphatase: 66 U/L (ref 38–126)
Anion gap: 13 (ref 5–15)
BUN: 14 mg/dL (ref 6–20)
CO2: 23 mmol/L (ref 22–32)
Calcium: 8.9 mg/dL (ref 8.9–10.3)
Chloride: 102 mmol/L (ref 98–111)
Creatinine, Ser: 0.68 mg/dL (ref 0.44–1.00)
GFR calc Af Amer: >60 mL/min
GFR calc non Af Amer: >60 mL/min
Glucose, Bld: 331 mg/dL — ABNORMAL HIGH (ref 70–99)
Potassium: 3.3 mmol/L — ABNORMAL LOW (ref 3.5–5.1)
Sodium: 138 mmol/L (ref 135–145)
Total Bilirubin: 0.6 mg/dL (ref 0.3–1.2)
Total Protein: 6.9 g/dL (ref 6.5–8.1)

## 2020-02-29 LAB — GLUCOSE, CAPILLARY
Glucose-Capillary: 295 mg/dL — ABNORMAL HIGH (ref 70–99)
Glucose-Capillary: 294 mg/dL — ABNORMAL HIGH (ref 70–99)
Glucose-Capillary: 229 mg/dL — ABNORMAL HIGH (ref 70–99)
Glucose-Capillary: 175 mg/dL — ABNORMAL HIGH (ref 70–99)

## 2020-02-29 LAB — PROTIME-INR
INR: 1 (ref 0.8–1.2)
Prothrombin Time: 12.8 seconds (ref 11.4–15.2)

## 2020-02-29 LAB — URINE CULTURE

## 2020-02-29 LAB — PREGNANCY, URINE: Preg Test, Ur: NEGATIVE

## 2020-02-29 LAB — CBG MONITORING, ED
Glucose-Capillary: 259 mg/dL — ABNORMAL HIGH (ref 70–99)
Glucose-Capillary: 338 mg/dL — ABNORMAL HIGH (ref 70–99)

## 2020-02-29 LAB — APTT: aPTT: 25 seconds (ref 24–36)

## 2020-02-29 LAB — TYPE AND SCREEN
ABO/RH(D): O NEG
Antibody Screen: NEGATIVE

## 2020-02-29 LAB — TSH: TSH: 4.028 u[IU]/mL (ref 0.350–4.500)

## 2020-02-29 LAB — LIPASE, BLOOD: Lipase: 14 U/L (ref 11–51)

## 2020-02-29 MED ORDER — SODIUM CHLORIDE 0.9 % IV SOLN
1.0000 g | Freq: Once | INTRAVENOUS | Status: AC
  Administered 2020-02-29: 1 g via INTRAVENOUS
  Filled 2020-02-29: qty 10

## 2020-02-29 MED ORDER — PANTOPRAZOLE SODIUM 40 MG IV SOLR
40.0000 mg | Freq: Two times a day (BID) | INTRAVENOUS | Status: DC
  Administered 2020-02-29 – 2020-03-01 (×2): 40 mg via INTRAVENOUS
  Filled 2020-02-29 (×2): qty 40

## 2020-02-29 MED ORDER — INSULIN ASPART 100 UNIT/ML ~~LOC~~ SOLN: 0.0000 [IU] | Freq: Every day | SUBCUTANEOUS | Status: DC

## 2020-02-29 MED ORDER — DESMOPRESSIN ACETATE 4 MCG/ML IJ SOLN: 0.3000 ug/kg | Freq: Once | INTRAMUSCULAR | Status: DC

## 2020-02-29 MED ORDER — SODIUM CHLORIDE 0.9 % IV BOLUS
1000.0000 mL | Freq: Once | INTRAVENOUS | Status: AC
  Administered 2020-02-29: 14:00:00 1000 mL via INTRAVENOUS

## 2020-02-29 MED ORDER — SODIUM CHLORIDE 0.9 % IV SOLN: INTRAVENOUS | Status: DC

## 2020-02-29 MED ORDER — PROMETHAZINE HCL 25 MG/ML IJ SOLN
12.5000 mg | Freq: Once | INTRAMUSCULAR | Status: AC
  Administered 2020-02-29: 12.5 mg via INTRAVENOUS
  Filled 2020-02-29: qty 1

## 2020-02-29 MED ORDER — INSULIN GLARGINE 100 UNIT/ML ~~LOC~~ SOLN
10.0000 [IU] | Freq: Every day | SUBCUTANEOUS | Status: DC
  Administered 2020-02-29: 10 [IU] via SUBCUTANEOUS
  Filled 2020-02-29 (×2): qty 0.1

## 2020-02-29 MED ORDER — HYDROMORPHONE HCL 1 MG/ML IJ SOLN: 1.0000 mg | Freq: Once | INTRAMUSCULAR | Status: DC

## 2020-02-29 MED ORDER — IOHEXOL 300 MG/ML  SOLN
100.0000 mL | Freq: Once | INTRAMUSCULAR | Status: AC | PRN
  Administered 2020-02-29: 10:00:00 100 mL via INTRAVENOUS

## 2020-02-29 MED ORDER — SODIUM CHLORIDE 0.9 % IV SOLN
1.0000 g | INTRAVENOUS | Status: DC
  Administered 2020-03-01: 1 g via INTRAVENOUS
  Filled 2020-02-29: qty 10

## 2020-02-29 MED ORDER — PANTOPRAZOLE SODIUM 40 MG IV SOLR
40.0000 mg | Freq: Once | INTRAVENOUS | Status: AC
  Administered 2020-02-29: 40 mg via INTRAVENOUS
  Filled 2020-02-29: qty 40

## 2020-02-29 MED ORDER — PROMETHAZINE HCL 25 MG RE SUPP: 25.0000 mg | Freq: Three times a day (TID) | RECTAL | Status: DC | PRN

## 2020-02-29 MED ORDER — CAPSAICIN 0.025 % EX CREA
TOPICAL_CREAM | Freq: Two times a day (BID) | CUTANEOUS | Status: DC
  Filled 2020-02-29: qty 60

## 2020-02-29 MED ORDER — INSULIN ASPART 100 UNIT/ML IV SOLN
5.0000 [IU] | Freq: Once | INTRAVENOUS | Status: AC
  Administered 2020-02-29: 10:00:00 5 [IU] via INTRAVENOUS

## 2020-02-29 MED ORDER — SODIUM CHLORIDE 0.9 % IV BOLUS
1000.0000 mL | Freq: Once | INTRAVENOUS | Status: AC
  Administered 2020-02-29: 1000 mL via INTRAVENOUS

## 2020-02-29 MED ORDER — INSULIN ASPART 100 UNIT/ML ~~LOC~~ SOLN
0.0000 [IU] | Freq: Three times a day (TID) | SUBCUTANEOUS | Status: DC
  Administered 2020-02-29: 6 [IU] via SUBCUTANEOUS

## 2020-02-29 MED ORDER — METOCLOPRAMIDE HCL 5 MG/ML IJ SOLN
10.0000 mg | Freq: Once | INTRAMUSCULAR | Status: AC
  Administered 2020-02-29: 14:00:00 10 mg via INTRAVENOUS
  Filled 2020-02-29: qty 2

## 2020-02-29 MED ORDER — ONDANSETRON HCL 4 MG/2ML IJ SOLN
4.0000 mg | Freq: Four times a day (QID) | INTRAMUSCULAR | Status: DC | PRN
  Administered 2020-02-29 – 2020-03-01 (×2): 4 mg via INTRAVENOUS
  Filled 2020-02-29 (×2): qty 2

## 2020-02-29 MED ORDER — HYDROMORPHONE HCL 1 MG/ML IJ SOLN
0.5000 mg | Freq: Once | INTRAMUSCULAR | Status: AC
  Administered 2020-02-29: 0.5 mg via INTRAVENOUS
  Filled 2020-02-29: qty 1

## 2020-02-29 MED ORDER — POTASSIUM CHLORIDE 10 MEQ/100ML IV SOLN
10.0000 meq | INTRAVENOUS | Status: AC
  Administered 2020-02-29 (×4): 10 meq via INTRAVENOUS
  Filled 2020-02-29 (×4): qty 100

## 2020-02-29 MED ORDER — SODIUM CHLORIDE 0.9 % IV SOLN
0.3000 ug/kg | Freq: Once | INTRAVENOUS | Status: AC
  Administered 2020-02-29: 16.4 ug via INTRAVENOUS
  Filled 2020-02-29: qty 4.1

## 2020-02-29 MED ORDER — INSULIN REGULAR BOLUS VIA INFUSION: 5.0000 [IU] | Freq: Once | INTRAVENOUS | Status: DC

## 2020-02-29 NOTE — ED Provider Notes (Addendum)
[facility] EMERGENCY DEPARTMENT Provider Note   CSN: [id number] Arrival date & time: 02/29/20  0555     History Chief Complaint  Patient presents with  . Emesis    Tammy Higgins is a 24 y.o. female.  Patient seen in the emergency department on April 9 2 days ago for nausea vomiting and diarrhea.  Patient is type I diabetic.  Patient's urinalysis questionable urinary tract infection started on Keflex at that time.  Patient returns today with persistent nausea vomiting and diarrhea and vomiting up coffee-ground maroon blood.  Patient is a type I von Willebrand's patient.  Patient blood sugar on presentation here was in the upper 200s.  Patient with diffuse abdominal pain.  Patient was discharged home on Phenergan which normally helps her with the vomiting helped when she was here but it she had persistent vomiting at home.  No blood in the bowel movements.   Patient last seen by pediatric hematology at [facility].  Last had parameter labs done in December 2018.  Their notes show that her VWF activity was 98% that her VW F antigen was 120% she had normal multimer distribution her Factor VIII was 178.        Past Medical History:  Diagnosis Date  . Diabetes mellitus without complication (HCC)   . Hypothyroidism   . Von Willebrand disease, type I (HCC)     There are no problems to display for this patient.   Past Surgical History:  Procedure Laterality Date  . INCISION AND DRAINAGE FOOT Left   . TONSILLECTOMY       OB History   No obstetric history on file.     History reviewed. No pertinent family history.  Social History   Tobacco Use  . Smoking status: Never Smoker  . Smokeless tobacco: Never Used  Substance Use Topics  . Alcohol use: Yes    Comment: occasionally  . Drug use: Yes    Types: Marijuana    Home Medications Prior to Admission medications   Medication Sig Start Date End Date Taking? Authorizing Provider  acetaminophen (TYLENOL) 500 MG tablet  Take 1,000 mg by mouth every 6 (six) hours as needed.   Yes [provider]  cephALEXin (KEFLEX) 500 MG capsule Take 1 capsule (500 mg total) by mouth 2 (two) times daily for 5 days. 02/27/20 03/03/20 Yes Harlene Salts A, PA-C  insulin aspart (NOVOLOG) 100 UNIT/ML injection Inject 1-7 Units into the skin 3 (three) times daily before meals. Uses according to a sliding scale at home.    Yes [provider]  insulin glargine (LANTUS) 100 UNIT/ML injection Inject 10 Units into the skin at bedtime.    Yes [provider]  promethazine (PHENERGAN) 25 MG tablet Take 1 tablet (25 mg total) by mouth every 8 (eight) hours as needed for nausea or vomiting. 02/27/20  Yes Harlene Salts A, PA-C  ondansetron (ZOFRAN) 4 MG tablet Take 1 tablet (4 mg total) by mouth every 6 (six) hours. Patient not taking: Reported on 02/29/2020 11/21/13   Ivery Quale, PA-C  oseltamivir (TAMIFLU) 75 MG capsule Take 1 capsule (75 mg total) by mouth every 12 (twelve) hours. Patient not taking: Reported on 02/29/2020 11/21/13   Ivery Quale, PA-C    Allergies    Patient has no known allergies.  Review of Systems   Review of Systems  Constitutional: Negative for chills and fever.  HENT: Negative for congestion, rhinorrhea and sore throat.   Eyes: Negative for visual disturbance.  Respiratory:  Negative for cough and shortness of breath.   Cardiovascular: Negative for chest pain and leg swelling.  Gastrointestinal: Positive for abdominal pain, diarrhea, nausea and vomiting. Negative for blood in stool.  Genitourinary: Negative for dysuria and hematuria.  Musculoskeletal: Negative for back pain and neck pain.  Skin: Negative for rash.  Neurological: Negative for dizziness, light-headedness and headaches.  Hematological: Bruises/bleeds easily.  Psychiatric/Behavioral: Negative for confusion.    Physical Exam Updated Vital Signs BP 124/75   Pulse 92   Temp 98.1 F (36.7 C) (Oral)   Resp 17  Comment: Simultaneous filing. User may not have seen previous data.  LMP 02/27/2020 Comment: neg preg 02/27/20  SpO2 100%   Physical Exam Vitals and nursing note reviewed.  Constitutional:      General: She is in acute distress.     Appearance: Normal appearance. She is well-developed. She is ill-appearing and toxic-appearing.  HENT:     Head: Normocephalic and atraumatic.     Mouth/Throat:     Mouth: Mucous membranes are dry.  Eyes:     Extraocular Movements: Extraocular movements intact.     Conjunctiva/sclera: Conjunctivae normal.     Pupils: Pupils are equal, round, and reactive to light.  Cardiovascular:     Rate and Rhythm: Normal rate and regular rhythm.     Heart sounds: No murmur.  Pulmonary:     Effort: Pulmonary effort is normal. No respiratory distress.     Breath sounds: Normal breath sounds.  Abdominal:     Palpations: Abdomen is soft.     Tenderness: There is abdominal tenderness.     Comments: Diffuse tenderness no guarding  Musculoskeletal:        General: No swelling. Normal range of motion.     Cervical back: Normal range of motion and neck supple.  Skin:    General: Skin is warm and dry.     Capillary Refill: Capillary refill takes less than 2 seconds.  Neurological:     General: No focal deficit present.     Mental Status: She is alert and oriented to person, place, and time.     ED Results / Procedures / Treatments   Labs (all labs ordered are listed, but only abnormal results are displayed) Labs Reviewed  COMPREHENSIVE METABOLIC PANEL - Abnormal; Notable for the following components:      Result Value   Potassium 3.3 (*)    Glucose, Bld 331 (*)    All other components within normal limits  CBC WITH DIFFERENTIAL/PLATELET - Abnormal; Notable for the following components:   Neutro Abs 8.4 (*)    All other components within normal limits  URINALYSIS, ROUTINE W REFLEX MICROSCOPIC - Abnormal; Notable for the following components:   Glucose, UA >=500  (*)    Hgb urine dipstick LARGE (*)    Ketones, ur 20 (*)    Protein, ur 30 (*)    Leukocytes,Ua SMALL (*)    Bacteria, UA MANY (*)    All other components within normal limits  CBG MONITORING, ED - Abnormal; Notable for the following components:   Glucose-Capillary 259 (*)    All other components within normal limits  URINE CULTURE  SARS CORONAVIRUS 2 (TAT 6-24 HRS)  LIPASE, BLOOD  PREGNANCY, URINE  PROTIME-INR  APTT  VON WILLEBRAND ANTIGEN  VON WILLEBRAND FACTOR MULTIMER  FACTOR 8 RISTOCETIN COFACTOR  TYPE AND SCREEN    EKG None  Radiology CT Abdomen Pelvis W Contrast  Result Date: 02/29/2020 CLINICAL DATA:  Nausea  and vomiting for 2 days EXAM: CT ABDOMEN AND PELVIS WITH CONTRAST TECHNIQUE: Multidetector CT imaging of the abdomen and pelvis was performed using the standard protocol following bolus administration of intravenous contrast. CONTRAST:  OMNIPAQUE IOHEXOL 300 MG/ML  SOLN COMPARISON:  None. FINDINGS: Technical note: Examination is mildly degraded by patient motion. Lower chest: No acute abnormality. Hepatobiliary: No focal liver abnormality is seen. No gallstones, gallbladder wall thickening, or biliary dilatation. Pancreas: Grossly unremarkable. Spleen: Normal in size without focal abnormality. Adrenals/Urinary Tract: Unremarkable adrenal glands. Small wedge-shaped area of hypoenhancement of the midpole of the left kidney (series 5, image 50). No hydronephrosis. No renal stone. No perinephric stranding. Circumferential urinary bladder wall thickening. Stomach/Bowel: Stomach is within normal limits. Multiple fluid-filled, nondilated loops of small bowel with mild long segment bowel wall thickening suggestive of a nonspecific enteritis. Appendix appears normal (series 6, images 36-42). No evidence of focal bowel wall thickening, distention, or inflammatory changes. Vascular/Lymphatic: No significant vascular findings are present. No enlarged abdominal or pelvic lymph  nodes. Reproductive: Retroverted uterus. No adnexal mass. Other: Trace fluid within the cul-de-sac, which may be physiologic. No abdominal wall hernia. Musculoskeletal: No acute or significant osseous findings. IMPRESSION: 1. Small wedge-shaped area of hypoenhancement involving the midpole of the left kidney, suspicious for pyelonephritis. 2. Circumferential urinary bladder wall thickening, suggestive of cystitis. Correlate with urinalysis. 3. Multiple fluid-filled, nondilated loops of small bowel with mild long segment bowel wall thickening suggestive of a nonspecific enteritis. 4. Trace fluid within the cul-de-sac, likely physiologic. Electronically Signed   By: Duanne Guess D.O.   On: 02/29/2020 11:04    Procedures Procedures (including critical care time)   CRITICAL CARE Performed by: Vanetta Mulders Total critical care time: 40 minutes Critical care time was exclusive of separately billable procedures and treating other patients. Critical care was necessary to treat or prevent imminent or life-threatening deterioration. Critical care was time spent personally by me on the following activities: development of treatment plan with patient and/or surrogate as well as nursing, discussions with consultants, evaluation of patient's response to treatment, examination of patient, obtaining history from patient or surrogate, ordering and performing treatments and interventions, ordering and review of laboratory studies, ordering and review of radiographic studies, pulse oximetry and re-evaluation of patient's condition.   Medications Ordered in ED Medications  0.9 %  sodium chloride infusion ( Intravenous New Bag/Given 02/29/20 1119)  desmopressin (DDAVP) injection 16.4 mcg (has no administration in time range)  sodium chloride 0.9 % bolus 1,000 mL (0 mLs Intravenous Stopped 02/29/20 0946)  promethazine (PHENERGAN) injection 12.5 mg (12.5 mg Intravenous Given 02/29/20 0755)  pantoprazole (PROTONIX)  injection 40 mg (40 mg Intravenous Given 02/29/20 0756)  HYDROmorphone (DILAUDID) injection 0.5 mg (0.5 mg Intravenous Given 02/29/20 0756)  insulin aspart (novoLOG) injection 5 Units (5 Units Intravenous Given 02/29/20 0945)  iohexol (OMNIPAQUE) 300 MG/ML solution 100 mL (100 mLs Intravenous Contrast Given 02/29/20 1017)  promethazine (PHENERGAN) injection 12.5 mg (12.5 mg Intravenous Given 02/29/20 0953)  cefTRIAXone (ROCEPHIN) 1 g in sodium chloride 0.9 % 100 mL IVPB (0 g Intravenous Stopped 02/29/20 1148)    ED Course  I have reviewed the triage vital signs and the nursing notes.  Pertinent labs & imaging results that were available during my care of the patient were reviewed by me and considered in my medical decision making (see chart for details).    MDM Rules/Calculators/A&P  Patient not in DKA although looked dehydrated.  Hydrated here with fluids and did respond well to IV Phenergan.  Patient with some maroon coffee-ground emesis so technically hematemesis.  Discussed with hematology oncology because of her von Willebrand's disease.  And they recommended her receiving the DDAVP.  That was ordered for the bleeding and they are planning to see her in consultation tomorrow.  Patient's urinalysis today highly suggestive of worsening urinary tract infection.  CT scan also confirms inflammation of the bladder consistent with cystitis.  Raises concerns for right-sided pyelonephritis.  Clinically this seems to fit.  Patient was discharged on Keflex only got a few doses in because of the persistent vomiting.  Feel patient warrants admission for pyelonephritis and a type I diabetic.  Also has the hematemesis.  And needs to be observed her hemoglobin is good currently.  She was not having any blood in her vomit when she was seen the other day.  Also based on CT there is a component of enteritis.  Which would fit into a viral gastroenteritis.  With putting everything together feel  patient's best admitted for IV fluids hemoglobin checks follow-up with hematology and to make sure that she shows signs of improvement and can keep things down before being discharged home.  Final Clinical Impression(s) / ED Diagnoses Final diagnoses:  Pyelonephritis  Nausea vomiting and diarrhea  Type 1 diabetes mellitus without complication [facility])    Rx / DC Orders ED Discharge Orders    None       Fredia Sorrow, MD 02/29/20 1251    Fredia Sorrow, MD 02/29/20 1252

## 2020-02-29 NOTE — H&P (Signed)
Patient Demographics:    Tammy Higgins, is a 24 y.o. female  MRN: [id number]   DOB - July 20, 1996  Admit Date - 02/29/2020  Outpatient Primary MD for the patient is System, Pcp Not In   Assessment & Plan:    Principal Problem:   Emesis, persistent Active Problems:   Diabetes mellitus type 1, uncontrolled (HCC)   Von Willebrand disease (HCC)   Gastroenteritis   Cannabis abuse   Cannabis hyperemesis syndrome concurrent with and due to cannabis abuse (HCC)   UTI (urinary tract infection)   Pyelonephritis  1)Intractable Emesis--- multifactorial etiology suspect viral gastroenteritis, compounded by gastroparesis in the patient who is being a type I diabetic for about 19 years, cannot rule out some added contribution by cannabinoid hyperemesis syndrome given the patient uses THC on a daily basis -Emesis persistent despite Zofran and Phenergan use -Given gastroenteritis and possible gastroparesis  patient treated with IV Reglan -Lipase 14 -wbc 10.4, Hgb 14.0 and 42.0, platelets 203 -May use IV Zofran as prescribed -May use Phenergan per rectum as needed if Zofran not effective -Patient has some blood streaking in the emesis but no frank hematemesis --Monitor H&H , give Protonix as ordered  2)Possible left-sided pyelonephritis/possible UTI--- urine culture from 02/27/2020 appears contaminated -Patient is having diarrhea which may explain some of the urine findings -Treat empirically with IV Rocephin pending repeat culture results --Patient has no fevers, no leukocytosis, no flank pain, also denies dysuria -Prescribed Keflex on 02/27/2020 however had emesis and could not keep it down  3)DM1--patient was diagnosed with type I DM at age 48,  -creatinine is 0.68, pot is 3.3, bicarb 23 and AG 13 ---no recent A1c available  here, per patient A1c usually around 9--reflecting uncontrolled DM PTA -Given emesis and diarrhea will avoid over aggressive glucose control at this time -Hold Lantus Allow some permissive Hyperglycemia rather than risk life-threatening hypoglycemia in a patient with unreliable oral intake. Use Novolog/Humalog Sliding scale insulin with Accu-Cheks/Fingersticks as ordered  4)Von Willebrand's disease--- EDP discussed with on-call hematologist Dr. Ellin Saba -INR 1.0, APTT 25 -Labs ordered and pending  5)Hypothyroidism----recheck TSH  With History of - Reviewed by me  Past Medical History:  Diagnosis Date  . Diabetes mellitus without complication (HCC)   . Hypothyroidism   . Von Willebrand disease, type I [facility])       Past Surgical History:  Procedure Laterality Date  . INCISION AND DRAINAGE FOOT Left   . TONSILLECTOMY        Chief Complaint  Patient presents with  . Emesis      HPI:    Tammy Higgins  is a 24 y.o. female with past medical history relevant for DM 1 diagnosed at age 69, hypothyroidism and von Willebrand's disease presents to the ED for the second time since 02/27/2020 with concerns about persistent emesis diarrhea abdominal discomfort --Patient was seen in the ED on 02/27/2020 treated and released and given Keflex for possible UTI -  Urine culture from 02/27/2019 and 1 appears contaminated,  no growth identified -Patient has no fevers, no leukocytosis, no flank pain, also denies dysuria -Prescribed Keflex on 02/27/2020 however had emesis and could not keep it down -CT abdomen and pelvis suggesting cystitis and possible left-sided pyelonephritis  -Emesis has persisted despite patient using Phenergan, Patient has some blood streaking in the emesis but no frank hematemesis -H&H appears stable -Diarrhea is without blood or mucus  --- LMP- 02/27/20--- usually heavy and usually last 7 days H/o  von Willebrand's disease--- EDP discussed with on-call hematologist Dr.  Delton Coombes INR 1.0, APTT 25 -Labs ordered and pending  --Additional history obtained from patient's friend at bedside Becca -Denies headaches, no dizziness, no chest pains, no shortness of breath no palpitations, no visual disturbance, no syncope --- Patient admits to daily marijuana use In ED creatinine is 0.68, pot is 3.3, bicarb 23 and AG 13 Lipase 14 -wbc 10.4, Hgb 14.0 and 42.0, platelets 203   Review of systems:    In addition to the HPI above,   A full Review of  Systems was done, all other systems reviewed are negative except as noted above in HPI , .    Social History:  Reviewed by me    Social History   Tobacco Use  . Smoking status: Never Smoker  . Smokeless tobacco: Never Used  Substance Use Topics  . Alcohol use: Yes    Comment: occasionally     Family History :  Reviewed by me  HTN  Home Medications:   Prior to Admission medications   Medication Sig Start Date End Date Taking? Authorizing Provider  acetaminophen (TYLENOL) 500 MG tablet Take 1,000 mg by mouth every 6 (six) hours as needed.   Yes [provider]  cephALEXin (KEFLEX) 500 MG capsule Take 1 capsule (500 mg total) by mouth 2 (two) times daily for 5 days. 02/27/20 03/03/20 Yes Nuala Alpha A, PA-C  insulin aspart (NOVOLOG) 100 UNIT/ML injection Inject 1-7 Units into the skin 3 (three) times daily before meals. Uses according to a sliding scale at home.    Yes [provider]  insulin glargine (LANTUS) 100 UNIT/ML injection Inject 10 Units into the skin at bedtime.    Yes [provider]  promethazine (PHENERGAN) 25 MG tablet Take 1 tablet (25 mg total) by mouth every 8 (eight) hours as needed for nausea or vomiting. 02/27/20  Yes Nuala Alpha A, PA-C  ondansetron (ZOFRAN) 4 MG tablet Take 1 tablet (4 mg total) by mouth every 6 (six) hours. Patient not taking: Reported on 02/29/2020 11/21/13   Lily Kocher, PA-C  oseltamivir (TAMIFLU) 75 MG capsule Take 1 capsule (75  mg total) by mouth every 12 (twelve) hours. Patient not taking: Reported on 02/29/2020 11/21/13   Lily Kocher, PA-C     Allergies:    No Known Allergies   Physical Exam:   Vitals  Blood pressure 119/70, pulse (!) 106, temperature 98.9 F (37.2 C), temperature source Oral, resp. rate 18, height 5\' 4"  (1.626 m), weight 52 kg, last menstrual period 02/27/2020, SpO2 100 %.  Physical Examination: General appearance - alert, episodes of emesis noted   Mental status - alert, oriented to person, place, and time,  Eyes - sclera anicteric Neck - supple, no JVD elevation , Chest - clear  to auscultation bilaterally, symmetrical air movement,  Heart - S1 and S2 normal, regular  Abdomen - soft, nondistended, generalized abdominal tenderness without rebound or guarding, no CVA area tenderness  neurological - screening mental status exam normal, neck supple without rigidity, cranial nerves II through XII intact, DTR's normal and symmetric Extremities - no pedal edema noted, intact peripheral pulses  Skin - warm, dry   Data Review:    CBC Recent Labs  Lab 02/27/20 1411 02/29/20 0735  WBC 17.4* 10.4  HGB 15.5* 14.0  HCT 46.5* 42.0  PLT 252 203  MCV 92.3 92.7  MCH 30.8 30.9  MCHC 33.3 33.3  RDW 12.3 12.3  LYMPHSABS 1.5 1.4  MONOABS 1.0 0.5  EOSABS 0.0 0.0  BASOSABS 0.1 0.1   ------------------------------------------------------------------------------------------------------------------ Chemistries  Recent Labs  Lab 02/27/20 1411 02/29/20 0735  NA 138 138  K 3.4* 3.3*  CL 101 102  CO2 25 23  GLUCOSE 274* 331*  BUN 14 14  CREATININE 0.65 0.68  CALCIUM 9.4 8.9  AST 24 20  ALT 21 20  ALKPHOS 80 66  BILITOT 0.5 0.6   ------------------------------------------------------------------------------------------------------------------ estimated creatinine clearance is 89 mL/min (by C-G formula based on SCr of 0.68  mg/dL). ------------------------------------------------------------------------------------------------------------------ Recent Labs    02/27/20 1542  TSH 3.446     Coagulation profile Recent Labs  Lab 02/29/20 0812  INR 1.0   ------------------------------------------------------------------------------------------------------------------- No results for input(s): DDIMER in the last 72 hours. -------------------------------------------------------------------------------------------------------------------  Cardiac Enzymes No results for input(s): CKMB, TROPONINI, MYOGLOBIN in the last 168 hours.  Invalid input(s): CK ------------------------------------------------------------------------------------------------------------------ No results found for: BNP ---------------------------------------------------------------------------------------------------------------  Urinalysis    Component Value Date/Time   COLORURINE YELLOW 02/29/2020 0735   APPEARANCEUR CLEAR 02/29/2020 0735   LABSPEC 1.017 02/29/2020 0735   PHURINE 6.0 02/29/2020 0735   GLUCOSEU >=500 (A) 02/29/2020 0735   HGBUR LARGE (A) 02/29/2020 0735   BILIRUBINUR NEGATIVE 02/29/2020 0735   KETONESUR 20 (A) 02/29/2020 0735   PROTEINUR 30 (A) 02/29/2020 0735   UROBILINOGEN 0.2 11/21/2013 0033   NITRITE NEGATIVE 02/29/2020 0735   LEUKOCYTESUR SMALL (A) 02/29/2020 0735    ----------------------------------------------------------------------------------------------------------------   Imaging Results:    CT Abdomen Pelvis W Contrast  Result Date: 02/29/2020 CLINICAL DATA:  Nausea and vomiting for 2 days EXAM: CT ABDOMEN AND PELVIS WITH CONTRAST TECHNIQUE: Multidetector CT imaging of the abdomen and pelvis was performed using the standard protocol following bolus administration of intravenous contrast. CONTRAST:  OMNIPAQUE IOHEXOL 300 MG/ML  SOLN COMPARISON:  None. FINDINGS: Technical note: Examination  is mildly degraded by patient motion. Lower chest: No acute abnormality. Hepatobiliary: No focal liver abnormality is seen. No gallstones, gallbladder wall thickening, or biliary dilatation. Pancreas: Grossly unremarkable. Spleen: Normal in size without focal abnormality. Adrenals/Urinary Tract: Unremarkable adrenal glands. Small wedge-shaped area of hypoenhancement of the midpole of the left kidney (series 5, image 50). No hydronephrosis. No renal stone. No perinephric stranding. Circumferential urinary bladder wall thickening. Stomach/Bowel: Stomach is within normal limits. Multiple fluid-filled, nondilated loops of small bowel with mild long segment bowel wall thickening suggestive of a nonspecific enteritis. Appendix appears normal (series 6, images 36-42). No evidence of focal bowel wall thickening, distention, or inflammatory changes. Vascular/Lymphatic: No significant vascular findings are present. No enlarged abdominal or pelvic lymph nodes. Reproductive: Retroverted uterus. No adnexal mass. Other: Trace fluid within the cul-de-sac, which may be physiologic. No abdominal wall hernia. Musculoskeletal: No acute or significant osseous findings. IMPRESSION: 1. Small wedge-shaped area of hypoenhancement involving the midpole of the left kidney, suspicious for pyelonephritis. 2. Circumferential urinary bladder wall thickening, suggestive of cystitis. Correlate with urinalysis. 3. Multiple fluid-filled, nondilated loops of small bowel with mild long segment bowel  wall thickening suggestive of a nonspecific enteritis. 4. Trace fluid within the cul-de-sac, likely physiologic. Electronically Signed   By: Duanne Guess D.O.   On: 02/29/2020 11:04   Radiological Exams on Admission: CT Abdomen Pelvis W Contrast  Result Date: 02/29/2020 CLINICAL DATA:  Nausea and vomiting for 2 days EXAM: CT ABDOMEN AND PELVIS WITH CONTRAST TECHNIQUE: Multidetector CT imaging of the abdomen and pelvis was performed using the  standard protocol following bolus administration of intravenous contrast. CONTRAST:  OMNIPAQUE IOHEXOL 300 MG/ML  SOLN COMPARISON:  None. FINDINGS: Technical note: Examination is mildly degraded by patient motion. Lower chest: No acute abnormality. Hepatobiliary: No focal liver abnormality is seen. No gallstones, gallbladder wall thickening, or biliary dilatation. Pancreas: Grossly unremarkable. Spleen: Normal in size without focal abnormality. Adrenals/Urinary Tract: Unremarkable adrenal glands. Small wedge-shaped area of hypoenhancement of the midpole of the left kidney (series 5, image 50). No hydronephrosis. No renal stone. No perinephric stranding. Circumferential urinary bladder wall thickening. Stomach/Bowel: Stomach is within normal limits. Multiple fluid-filled, nondilated loops of small bowel with mild long segment bowel wall thickening suggestive of a nonspecific enteritis. Appendix appears normal (series 6, images 36-42). No evidence of focal bowel wall thickening, distention, or inflammatory changes. Vascular/Lymphatic: No significant vascular findings are present. No enlarged abdominal or pelvic lymph nodes. Reproductive: Retroverted uterus. No adnexal mass. Other: Trace fluid within the cul-de-sac, which may be physiologic. No abdominal wall hernia. Musculoskeletal: No acute or significant osseous findings. IMPRESSION: 1. Small wedge-shaped area of hypoenhancement involving the midpole of the left kidney, suspicious for pyelonephritis. 2. Circumferential urinary bladder wall thickening, suggestive of cystitis. Correlate with urinalysis. 3. Multiple fluid-filled, nondilated loops of small bowel with mild long segment bowel wall thickening suggestive of a nonspecific enteritis. 4. Trace fluid within the cul-de-sac, likely physiologic. Electronically Signed   By: Duanne Guess D.O.   On: 02/29/2020 11:04    DVT Prophylaxis -SCD/TEDs AM Labs Ordered, also please review Full Orders  Family  Communication: Admission, patients condition and plan of care including tests being ordered have been discussed with the patient and friend [place] who indicate understanding and agree with the plan   Code Status - Full Code  Likely DC to  Home   Condition   Stable  Shon Hale M.D on 02/29/2020 at 2:41 PM Go to [email or web address] -  for contact info  [facility] - Office  [phone]

## 2020-02-29 NOTE — ED Notes (Signed)
Pt continues to vomit, Dr. Marisa Severin  In room.

## 2020-02-29 NOTE — ED Triage Notes (Signed)
Pt C/O vomiting and diarrhea that started at 0800 Yesterday. Pt reports being diagnosed with a "stomch bug 2 days ago."

## 2020-03-01 LAB — BASIC METABOLIC PANEL
Anion gap: 8 (ref 5–15)
BUN: 9 mg/dL (ref 6–20)
CO2: 22 mmol/L (ref 22–32)
Calcium: 8 mg/dL — ABNORMAL LOW (ref 8.9–10.3)
Chloride: 110 mmol/L (ref 98–111)
Creatinine, Ser: 0.48 mg/dL (ref 0.44–1.00)
GFR calc Af Amer: >60 mL/min (ref >60)
GFR calc non Af Amer: >60 mL/min (ref >60)
Glucose, Bld: 117 mg/dL — ABNORMAL HIGH (ref 70–99)
Potassium: 3 mmol/L — ABNORMAL LOW (ref 3.5–5.1)
Sodium: 140 mmol/L (ref 135–145)

## 2020-03-01 LAB — CBC
HCT: 34.6 % — ABNORMAL LOW (ref 36.0–46.0)
Hemoglobin: 11.6 g/dL — ABNORMAL LOW (ref 12.0–15.0)
MCH: 31.1 pg (ref 26.0–34.0)
MCHC: 33.5 g/dL (ref 30.0–36.0)
MCV: 92.8 fL (ref 80.0–100.0)
Platelets: 175 10*3/uL (ref 150–400)
RBC: 3.73 MIL/uL — ABNORMAL LOW (ref 3.87–5.11)
RDW: 12.6 % (ref 11.5–15.5)
WBC: 8.7 10*3/uL (ref 4.0–10.5)
nRBC: 0 % (ref 0.0–0.2)

## 2020-03-01 LAB — GLUCOSE, CAPILLARY
Glucose-Capillary: 60 mg/dL — ABNORMAL LOW (ref 70–99)
Glucose-Capillary: 101 mg/dL — ABNORMAL HIGH (ref 70–99)
Glucose-Capillary: 152 mg/dL — ABNORMAL HIGH (ref 70–99)

## 2020-03-01 LAB — SARS CORONAVIRUS 2 (TAT 6-24 HRS): SARS Coronavirus 2: NEGATIVE

## 2020-03-01 LAB — VON WILLEBRAND ANTIGEN: Von Willebrand Antigen, Plasma: 172 % (ref 50–200)

## 2020-03-01 LAB — FACTOR 8 RISTOCETIN COFACTOR: Ristocetin Co-factor, Plasma: 182 % (ref 50–200)

## 2020-03-01 MED ORDER — OMEPRAZOLE 20 MG PO CPDR: 20.0000 mg | DELAYED_RELEASE_CAPSULE | Freq: Every day | ORAL | 1 refills | Status: DC

## 2020-03-01 MED ORDER — CAPSAICIN 0.025 % EX CREA: TOPICAL_CREAM | Freq: Two times a day (BID) | CUTANEOUS | 0 refills | Status: DC

## 2020-03-01 MED ORDER — INSULIN ASPART 100 UNIT/ML ~~LOC~~ SOLN: 0.0000 [IU] | Freq: Three times a day (TID) | SUBCUTANEOUS | Status: DC

## 2020-03-01 MED ORDER — INSULIN ASPART 100 UNIT/ML ~~LOC~~ SOLN
0.0000 [IU] | Freq: Three times a day (TID) | SUBCUTANEOUS | Status: DC
  Administered 2020-03-01: 2 [IU] via SUBCUTANEOUS

## 2020-03-01 MED ORDER — INSULIN ASPART 100 UNIT/ML ~~LOC~~ SOLN
2.0000 [IU] | Freq: Three times a day (TID) | SUBCUTANEOUS | Status: DC
  Administered 2020-03-01: 13:00:00 2 [IU] via SUBCUTANEOUS

## 2020-03-01 MED ORDER — INSULIN ASPART 100 UNIT/ML ~~LOC~~ SOLN: 0.0000 [IU] | Freq: Every day | SUBCUTANEOUS | Status: DC

## 2020-03-01 MED ORDER — ONDANSETRON HCL 4 MG PO TABS: 4.0000 mg | ORAL_TABLET | Freq: Three times a day (TID) | ORAL | 0 refills | Status: DC | PRN

## 2020-03-01 MED ORDER — PROMETHAZINE HCL 25 MG PO TABS: 25.0000 mg | ORAL_TABLET | Freq: Three times a day (TID) | ORAL | 0 refills | Status: DC | PRN

## 2020-03-01 NOTE — TOC Progression Note (Signed)
Pt observation status. Per MD, pt will dc home today. Pt does not currently have health insurance. Received consult to assist pt in establishing with a PCP. Spoke with pt by phone to assess. Pt states that she currently sees a DM specialist in [facility] but that she does not have a PCP. Per pt, she is on a medication assistance program for her insulin and she just recently completed the yearly renewal paperwork for that. She states that she has her insulin at home.   Discussed referral to [facility] with pt and she is agreeable. She has given verbal permission for this LCSW to fax over requested clinical information from this stay. Once the information is received, clinic staff will contact pt to schedule with a provider. Pt aware.  There are no other TOC needs identified at this time.

## 2020-03-01 NOTE — Discharge Instructions (Signed)
1)Please keep your appointment with your Diabetic Specialist in [place] 2)Please establish care with [facility] ([facility])  3)Abstinence from Marijuana advised due to concerns about possible cannabinoid hyperemesis syndrome 4) please take your insulin as prescribed 5)Take Keflex antibiotic for presumed urinary tract infection as prescribed 6) please call [phone] on Tuesday, 03/02/2020 to get report on your urine culture and see if your antibiotics need to be changed 7) please follow-up with Dr. Diona Browner at [facility]---[phone]--for further management of your von Willebrand's disease

## 2020-03-01 NOTE — Discharge Summary (Signed)
Tammy Higgins, is a 24 y.o. female  DOB 03/21/1996  MRN [id number].  Admission date:  02/29/2020  Admitting Physician  Shon Haleourage Mekenna Finau, MD  Discharge Date:  03/01/2020   Primary MD  System, Pcp Not In  Recommendations for primary care physician for things to follow:   1)[facility] 2)[facility]  3)Abstinence from Marijuana advised due to concerns about possible cannabinoid hyperemesis syndrome 4) please take your insulin as prescribed 5)Take Keflex antibiotic for presumed urinary tract infection as prescribed 6) please call [phone] on Tuesday, 03/02/2020 to get report on your urine culture and see if your antibiotics need to be changed 7) please follow-up with Dr. Diona BrownerKatragadda--hematologist at [facility]---[phone]--for further management of your von Willebrand's disease  Admission Diagnosis  Pyelonephritis [N12] Von Willebrand disease, type I (HCC) [D68.0] Type 1 diabetes mellitus without complication (HCC) [E10.9] Emesis, persistent [R11.15] Nausea vomiting and diarrhea [R11.2, R19.7] Hematemesis with nausea [K92.0]   Discharge Diagnosis  Pyelonephritis [N12] Von Willebrand disease, type I (HCC) [D68.0] Type 1 diabetes mellitus without complication (HCC) [E10.9] Emesis, persistent [R11.15] Nausea vomiting and diarrhea [R11.2, R19.7] Hematemesis with nausea [K92.0]    Principal Problem:   Emesis, persistent Active Problems:   Diabetes mellitus type 1, uncontrolled (HCC)   Von Willebrand disease (HCC)   Gastroenteritis   Cannabis abuse   Cannabis hyperemesis syndrome concurrent with and due to cannabis abuse (HCC)   UTI (urinary tract infection)   Pyelonephritis      Past Medical History:  Diagnosis Date  . Diabetes mellitus without complication (HCC)   .  Hypothyroidism   . Von Willebrand disease, type I [facility](HCC)     Past Surgical History:  Procedure Laterality Date  . INCISION AND DRAINAGE FOOT Left   . TONSILLECTOMY       HPI  from the history and physical done on the day of admission:    Tammy CageShannon Hakes  is a 24 y.o. female with past medical history relevant for DM 1 diagnosed at age 89, hypothyroidism and von Willebrand's disease presents to the ED for the second time since 02/27/2020 with concerns about persistent emesis diarrhea abdominal discomfort --Patient was seen in the ED on 02/27/2020 treated and released and given Keflex for possible UTI -Urine culture from 02/27/2019 and 1 appears contaminated,  no growth identified -Patient has no fevers, no leukocytosis, no flank pain, also denies dysuria -Prescribed Keflex on 02/27/2020 however had emesis and could not keep it down -CT abdomen and pelvis suggesting cystitis and possible left-sided pyelonephritis  -Emesis has persisted despite patient using Phenergan, Patient has some blood streaking in the emesis but no frank hematemesis -H&H appears stable -Diarrhea is without blood or mucus  --- LMP- 02/27/20--- usually heavy and usually last 7 days H/o  von Willebrand's disease--- EDP discussed with on-call hematologist Dr. Ellin SabaKatragadda INR 1.0, APTT 25 -Labs ordered and pending  --Additional history obtained from patient's friend at bedside Becca -Denies headaches,  no dizziness, no chest pains, no shortness of breath no palpitations, no visual disturbance, no syncope --- Patient admits to daily marijuana use In ED creatinine is 0.68, pot is 3.3, bicarb 23 and AG 13 Lipase 14 -wbc 10.4, Hgb 14.0 and 42.0, platelets 203    Hospital Course:        1)Intractable Emesis--- multifactorial etiology suspect viral gastroenteritis, compounded by gastroparesis in the patient who is being a type I diabetic for about 19 years, cannot rule out some added contribution by cannabinoid hyperemesis  syndrome given the patient uses THC on a daily basis - emesis resolved with antiemetics and hydration -Lipase 14 -wbc  Down to 8.7 from10.4, Hgb down to 11.6 from 14.0 ,  Patient currently having menstrual flow and patient had aggressive IV fluid hydration-- this explains reactive anemia platelets  175 -Patient has some blood streaking in the emesis but no frank hematemesis -- treated with PPI, no further emesis no further concerns about hematemesis  2)Possible left-sided pyelonephritis/possible UTI--- urine culture from 02/27/2020 appears contaminated -Patient is having diarrhea which may explain some of the urine findings--??? contamination  patient was treated with IV Rocephin,  --Patient has no fevers, no leukocytosis, no flank pain, also denies dysuria -Prescribed Keflex on 02/27/2020 during her previous visit  3)DM1--patient was diagnosed with type I DM at age 24,  - no DKA, A1c 8.9--reflecting uncontrolled DM PTA - resume home insulin regimen  4)Von Willebrand's disease--- EDP discussed with on-call hematologist Dr. Ellin Saba -INR 1.0, APTT 25 -Labs ordered and pending - outpatient follow-up with hematologist advised  5)Hypothyroidism---  TSH 4.0,   Discharge Condition:  stable  Follow UP  Follow-up Information    [facility] Follow up.   Why: Someone from the [facility] will call you to schedule a new patient appointment. Their number is [phone]           Consults obtained -   Diet and Activity recommendation:  As advised  Discharge Instructions    Discharge Instructions    Call MD for:  difficulty breathing, headache or visual disturbances   Complete by: As directed    Call MD for:  persistant dizziness or light-headedness   Complete by: As directed    Call MD for:  persistant nausea and vomiting   Complete by: As directed    Call MD for:  severe uncontrolled pain   Complete by: As directed    Call MD for:  temperature >100.4    Complete by: As directed    Diet Carb Modified   Complete by: As directed    Discharge instructions   Complete by: As directed    1)Please keep your [facility] with [facility] Special[facility] 2)[facility] ([facility])  3)Abstinence from Marijuana advised due to concerns about possible cannabinoid hyperemesis syndrome 4) please take your insulin as prescribed 5)Take Keflex antibiotic for presumed urinary tract infection as prescribed 6) please call [phone] on Tuesday, 03/02/2020 to get report on your urine culture and see if your antibiotics need to be changed 7) please follow-up with Dr. Diona Browner at [facility]---[phone]--for further management of your von Willebrand's disease   Increase activity slowly   Complete by: As directed         Discharge Medications     Allergies as of 03/01/2020   No Known Allergies     Medication List    STOP taking these medications   oseltamivir 75 MG  capsule Commonly known as: TAMIFLU     TAKE these medications   acetaminophen 500 MG tablet Commonly known as: TYLENOL Take 1,000 mg by mouth every 6 (six) hours as needed.   capsaicin 0.025 % cream Commonly known as: ZOSTRIX Apply topically 2 (two) times daily. Apply sparingly to skin twice a day--- if nausea/vomiting   cephALEXin 500 MG capsule Commonly known as: KEFLEX Take 1 capsule (500 mg total) by mouth 2 (two) times daily for 5 days.   insulin aspart 100 UNIT/ML injection Commonly known as: novoLOG Inject 1-7 Units into the skin 3 (three) times daily before meals. Uses according to a sliding scale at home.   insulin glargine 100 UNIT/ML injection Commonly known as: LANTUS Inject 10 Units into the skin at bedtime.   omeprazole 20 MG capsule Commonly known as: PriLOSEC Take 1 capsule (20 mg total) by mouth daily.   ondansetron 4 MG tablet Commonly known as: ZOFRAN Take 1 tablet (4 mg  total) by mouth every 8 (eight) hours as needed for nausea or vomiting. What changed:   when to take this  reasons to take this   promethazine 25 MG tablet Commonly known as: PHENERGAN Take 1 tablet (25 mg total) by mouth every 8 (eight) hours as needed for nausea or vomiting.       Major procedures and Radiology Reports - PLEASE review detailed and final reports for all details, in brief -   CT Abdomen Pelvis W Contrast  Result Date: 02/29/2020 CLINICAL DATA:  Nausea and vomiting for 2 days EXAM: CT ABDOMEN AND PELVIS WITH CONTRAST TECHNIQUE: Multidetector CT imaging of the abdomen and pelvis was performed using the standard protocol following bolus administration of intravenous contrast. CONTRAST:  167mL OMNIPAQUE IOHEXOL 300 MG/ML  SOLN COMPARISON:  None. FINDINGS: Technical note: Examination is mildly degraded by patient motion. Lower chest: No acute abnormality. Hepatobiliary: No focal liver abnormality is seen. No gallstones, gallbladder wall thickening, or biliary dilatation. Pancreas: Grossly unremarkable. Spleen: Normal in size without focal abnormality. Adrenals/Urinary Tract: Unremarkable adrenal glands. Small wedge-shaped area of hypoenhancement of the midpole of the left kidney (series 5, image 50). No hydronephrosis. No renal stone. No perinephric stranding. Circumferential urinary bladder wall thickening. Stomach/Bowel: Stomach is within normal limits. Multiple fluid-filled, nondilated loops of small bowel with mild long segment bowel wall thickening suggestive of a nonspecific enteritis. Appendix appears normal (series 6, images 36-42). No evidence of focal bowel wall thickening, distention, or inflammatory changes. Vascular/Lymphatic: No significant vascular findings are present. No enlarged abdominal or pelvic lymph nodes. Reproductive: Retroverted uterus. No adnexal mass. Other: Trace fluid within the cul-de-sac, which may be physiologic. No abdominal wall hernia.  Musculoskeletal: No acute or significant osseous findings. IMPRESSION: 1. Small wedge-shaped area of hypoenhancement involving the midpole of the left kidney, suspicious for pyelonephritis. 2. Circumferential urinary bladder wall thickening, suggestive of cystitis. Correlate with urinalysis. 3. Multiple fluid-filled, nondilated loops of small bowel with mild long segment bowel wall thickening suggestive of a nonspecific enteritis. 4. Trace fluid within the cul-de-sac, likely physiologic. Electronically Signed   By: Davina Poke D.O.   On: 02/29/2020 11:04    Micro Results    Recent Results (from the past 240 hour(s))  Respiratory Panel by RT PCR (Flu A&B, Covid) - Nasopharyngeal Swab     Status: None   Collection Time: 02/27/20  2:46 PM   Specimen: Nasopharyngeal Swab  Result Value Ref Range Status   SARS Coronavirus 2 by RT PCR NEGATIVE NEGATIVE Final  Comment: (NOTE) SARS-CoV-2 target nucleic acids are NOT DETECTED. The SARS-CoV-2 RNA is generally detectable in upper respiratoy specimens during the acute phase of infection. The lowest concentration of SARS-CoV-2 viral copies this assay can detect is 131 copies/mL. A negative result does not preclude SARS-Cov-2 infection and should not be used as the sole basis for treatment or other patient management decisions. A negative result may occur with  improper specimen collection/handling, submission of specimen other than nasopharyngeal swab, presence of viral mutation(s) within the areas targeted by this assay, and inadequate number of viral copies (<131 copies/mL). A negative result must be combined with clinical observations, patient history, and epidemiological information. The expected result is Negative. Fact Sheet for Patients:  https://www.moore.com/ Fact Sheet for Healthcare Providers:  https://www.young.biz/ This test is not yet ap proved or cleared by the [place] FDA and  has  been authorized for detection and/or diagnosis of SARS-CoV-2 by FDA under an Emergency Use Authorization (EUA). This EUA will remain  in effect (meaning this test can be used) for the duration of the COVID-19 declaration under Section 564(b)(1) of the Act, 21 U.S.C. section 360bbb-3(b)(1), unless the authorization is terminated or revoked sooner.    Influenza A by PCR NEGATIVE NEGATIVE Final   Influenza B by PCR NEGATIVE NEGATIVE Final    Comment: (NOTE) The Xpert Xpress SARS-CoV-2/FLU/RSV assay is intended as an aid in  the diagnosis of influenza from Nasopharyngeal swab specimens and  should not be used as a sole basis for treatment. Nasal washings and  aspirates are unacceptable for Xpert Xpress SARS-CoV-2/FLU/RSV  testing. Fact Sheet for Patients: https://www.moore.com/ Fact Sheet for Healthcare Providers: https://www.young.biz/ This test is not yet approved or cleared by the [place] FDA and  has been authorized for detection and/or diagnosis of SARS-CoV-2 by  FDA under an Emergency Use Authorization (EUA). This EUA will remain  in effect (meaning this test can be used) for the duration of the  Covid-19 declaration under Section 564(b)(1) of the Act, 21  U.S.C. section 360bbb-3(b)(1), unless the authorization is  terminated or revoked. Performed at [facility], [address], [place], [place] [address]   Urine culture     Status: Abnormal   Collection Time: 02/27/20  4:59 PM   Specimen: Urine, Clean Catch  Result Value Ref Range Status   Specimen Description   Final    URINE, CLEAN CATCH Performed at [facility], [address] [place], [place] [address]    Special Requests   Final    NONE Performed at [facility], [address], [place], [place] [address]    Culture MULTIPLE SPECIES PRESENT, SUGGEST RECOLLECTION (A)  Final   Report Status 02/29/2020 FINAL  Final  SARS CORONAVIRUS 2 (TAT 6-24 HRS) Nasopharyngeal  Nasopharyngeal Swab     Status: None   Collection Time: 02/29/20 11:37 AM   Specimen: Nasopharyngeal Swab  Result Value Ref Range Status   SARS Coronavirus 2 NEGATIVE NEGATIVE Final    Comment: (NOTE) SARS-CoV-2 target nucleic acids are NOT DETECTED. The SARS-CoV-2 RNA is generally detectable in upper and lower respiratory specimens during the acute phase of infection. Negative results do not preclude SARS-CoV-2 infection, do not rule out co-infections with other pathogens, and should not be used as the sole basis for treatment or other patient management decisions. Negative results must be combined with clinical observations, patient history, and epidemiological information. The expected result is Negative. Fact Sheet for Patients: HairSlick.no Fact Sheet for Healthcare Providers: quierodirigir.com This test is  not yet approved or cleared by the [place] and  has been authorized for detection and/or diagnosis of SARS-CoV-2 by FDA under an Emergency Use Authorization (EUA). This EUA will remain  in effect (meaning this test can be used) for the duration of the COVID-19 declaration under Section 56 4(b)(1) of the Act, 21 U.S.C. section 360bbb-3(b)(1), unless the authorization is terminated or revoked sooner. Performed at [facility], [address], [place], [place] [address]        Today   Subjective    Calvin Chura today has no  New concerns - no emesis, eating and drinking well, no diarrhea, - continues to have menstrual flow - no UTI type symptoms          Patient has been seen and examined prior to discharge   Objective   Blood pressure 122/74, pulse 92, temperature 98.7 F (37.1 C), temperature source Oral, resp. rate 16, height 5\' 4"  (1.626 m), weight 52 kg, last menstrual period 02/27/2020, SpO2 100 %.   Intake/Output Summary (Last 24 hours) at 03/01/2020 1336 Last data filed at 03/01/2020  0900 Gross per 24 hour  Intake 4603.68 ml  Output --  Net 4603.68 ml    Exam Gen:- Awake Alert, no acute distress  HEENT:- [place].AT, No sclera icterus Neck-Supple Neck,No JVD,.  Lungs-  CTAB , good air movement bilaterally CV- S1, S2 normal, regular Abd-  +ve B.Sounds, Abd Soft, No tenderness,  No CVA area tenderness   Extremity/Skin:- No  edema,   good pulses Psych-affect is appropriate, oriented x3 Neuro-no new focal deficits, no tremors    Data Review   CBC w Diff:  Lab Results  Component Value Date   WBC 8.7 03/01/2020   HGB 11.6 (L) 03/01/2020   HCT 34.6 (L) 03/01/2020   PLT 175 03/01/2020   LYMPHOPCT 13 02/29/2020   MONOPCT 5 02/29/2020   EOSPCT 0 02/29/2020   BASOPCT 1 02/29/2020    CMP:  Lab Results  Component Value Date   NA 140 03/01/2020   K 3.0 (L) 03/01/2020   CL 110 03/01/2020   CO2 22 03/01/2020   BUN 9 03/01/2020   CREATININE 0.48 03/01/2020   PROT 6.9 02/29/2020   ALBUMIN 4.2 02/29/2020   BILITOT 0.6 02/29/2020   ALKPHOS 66 02/29/2020   AST 20 02/29/2020   ALT 20 02/29/2020  .   Total Discharge time is about 33 minutes  04/30/2020 M.D on 03/01/2020 at 1:36 PM  Go to [email or web address] -  for contact info  [facility] - Office  [phone]

## 2020-03-01 NOTE — Progress Notes (Signed)
Inpatient Diabetes Program Recommendations  AACE/ADA: New Consensus Statement on Inpatient Glycemic Control (2015)  Target Ranges:  Prepandial:   less than 140 mg/dL      Peak postprandial:   less than 180 mg/dL (1-2 hours)      Critically ill patients:  140 - 180 mg/dL   Lab Results  Component Value Date   GLUCAP 101 (H) 03/01/2020    Review of Glycemic Control Results for [name], [name] (MRN [id number]) as of 03/01/2020 11:07  Ref. Range 02/29/2020 17:35 02/29/2020 19:36 02/29/2020 21:30 03/01/2020 07:16 03/01/2020 08:01  Glucose-Capillary Latest Ref Range: 70 - 99 mg/dL 854 (H) 883 (H) 014 (H) 60 (L) 101 (H)   Diabetes history: DM1(makes NO insulin; requires basal, correction, and meal coverage insulin) Outpatient Diabetes medications: Lantus 10 units + Novolog sliding scale for meals & correction Current orders for Inpatient glycemic control: Lantus 10 units + Novolog resistant scale 0-20 units + 0-5 units hs  Inpatient Diabetes Program Recommendations:   -Decrease Lantus to 8 units daily -Decrease Novolog correction to sensitive tid + hs 0-5 units -Novolog 2 units tid meal coverage if eats 50% meals  Thank you, Tammy Higgins. Hillman Attig, RN, MSN, CDE  Diabetes Coordinator Inpatient Glycemic Control Team Team Pager [phone] (8am-5pm) 03/01/2020 11:12 AM

## 2020-03-01 NOTE — Progress Notes (Signed)
Discharge instructions provided to patient. Medications reviewed. Follow up appointments reviewed. Patient walked out with significant other.

## 2020-03-02 LAB — URINE CULTURE

## 2020-03-03 LAB — HEMOGLOBIN A1C
Hgb A1c MFr Bld: 8.9 % — ABNORMAL HIGH (ref 4.8–5.6)
Mean Plasma Glucose: 209 mg/dL

## 2020-03-19 LAB — VON WILLEBRAND FACTOR MULTIMER

## 2020-03-29 ENCOUNTER — Encounter (HOSPITAL_COMMUNITY): Payer: Self-pay | Admitting: *Deleted

## 2020-03-29 ENCOUNTER — Other Ambulatory Visit: Payer: Self-pay

## 2020-03-29 ENCOUNTER — Emergency Department (HOSPITAL_COMMUNITY): Payer: Self-pay

## 2020-03-29 ENCOUNTER — Inpatient Hospital Stay (HOSPITAL_COMMUNITY)
Admission: EM | Admit: 2020-03-29 | Discharge: 2020-03-31 | DRG: 638 | Disposition: A | Discharge status: Home / Self Care | Payer: Self-pay | Attending: Family Medicine | Admitting: Family Medicine

## 2020-03-29 ENCOUNTER — Inpatient Hospital Stay (HOSPITAL_COMMUNITY): Payer: Self-pay

## 2020-03-29 DIAGNOSIS — Z794 Long term (current) use of insulin: Secondary | ICD-10-CM

## 2020-03-29 DIAGNOSIS — Z8249 Family history of ischemic heart disease and other diseases of the circulatory system: Secondary | ICD-10-CM

## 2020-03-29 DIAGNOSIS — Z79899 Other long term (current) drug therapy: Secondary | ICD-10-CM

## 2020-03-29 DIAGNOSIS — Z801 Family history of malignant neoplasm of trachea, bronchus and lung: Secondary | ICD-10-CM

## 2020-03-29 DIAGNOSIS — Z20822 Contact with and (suspected) exposure to covid-19: Secondary | ICD-10-CM | POA: Diagnosis present

## 2020-03-29 DIAGNOSIS — Z800 Family history of malignant neoplasm of digestive organs: Secondary | ICD-10-CM

## 2020-03-29 DIAGNOSIS — Z8261 Family history of arthritis: Secondary | ICD-10-CM

## 2020-03-29 DIAGNOSIS — R197 Diarrhea, unspecified: Secondary | ICD-10-CM | POA: Diagnosis present

## 2020-03-29 DIAGNOSIS — D6800 Von Willebrand disease, unspecified: Secondary | ICD-10-CM | POA: Diagnosis present

## 2020-03-29 DIAGNOSIS — E039 Hypothyroidism, unspecified: Secondary | ICD-10-CM | POA: Diagnosis present

## 2020-03-29 DIAGNOSIS — Z803 Family history of malignant neoplasm of breast: Secondary | ICD-10-CM

## 2020-03-29 DIAGNOSIS — R112 Nausea with vomiting, unspecified: Secondary | ICD-10-CM | POA: Diagnosis present

## 2020-03-29 DIAGNOSIS — E1010 Type 1 diabetes mellitus with ketoacidosis without coma: Principal | ICD-10-CM | POA: Diagnosis present

## 2020-03-29 DIAGNOSIS — E1110 Type 2 diabetes mellitus with ketoacidosis without coma: Secondary | ICD-10-CM | POA: Diagnosis present

## 2020-03-29 LAB — BASIC METABOLIC PANEL
Anion gap: >20 — ABNORMAL HIGH (ref 5–15)
Anion gap: 17 — ABNORMAL HIGH (ref 5–15)
Anion gap: 14 (ref 5–15)
BUN: 16 mg/dL (ref 6–20)
BUN: 15 mg/dL (ref 6–20)
BUN: 13 mg/dL (ref 6–20)
CO2: 12 mmol/L — ABNORMAL LOW (ref 22–32)
CO2: 12 mmol/L — ABNORMAL LOW (ref 22–32)
CO2: 15 mmol/L — ABNORMAL LOW (ref 22–32)
Calcium: 9.1 mg/dL (ref 8.9–10.3)
Calcium: 8.2 mg/dL — ABNORMAL LOW (ref 8.9–10.3)
Calcium: 8.2 mg/dL — ABNORMAL LOW (ref 8.9–10.3)
Chloride: 101 mmol/L (ref 98–111)
Chloride: 109 mmol/L (ref 98–111)
Chloride: 109 mmol/L (ref 98–111)
Creatinine, Ser: 0.78 mg/dL (ref 0.44–1.00)
Creatinine, Ser: 0.72 mg/dL (ref 0.44–1.00)
Creatinine, Ser: 0.58 mg/dL (ref 0.44–1.00)
GFR calc Af Amer: >60 mL/min (ref >60)
GFR calc Af Amer: >60 mL/min (ref >60)
GFR calc Af Amer: >60 mL/min (ref >60)
GFR calc non Af Amer: >60 mL/min (ref >60)
GFR calc non Af Amer: >60 mL/min (ref >60)
GFR calc non Af Amer: >60 mL/min (ref >60)
Glucose, Bld: 447 mg/dL — ABNORMAL HIGH (ref 70–99)
Glucose, Bld: 268 mg/dL — ABNORMAL HIGH (ref 70–99)
Glucose, Bld: 141 mg/dL — ABNORMAL HIGH (ref 70–99)
Potassium: 5.1 mmol/L (ref 3.5–5.1)
Potassium: 4.3 mmol/L (ref 3.5–5.1)
Potassium: 4 mmol/L (ref 3.5–5.1)
Sodium: 135 mmol/L (ref 135–145)
Sodium: 138 mmol/L (ref 135–145)
Sodium: 138 mmol/L (ref 135–145)

## 2020-03-29 LAB — CBC
HCT: 43.2 % (ref 36.0–46.0)
Hemoglobin: 14.1 g/dL (ref 12.0–15.0)
MCH: 30.9 pg (ref 26.0–34.0)
MCHC: 32.6 g/dL (ref 30.0–36.0)
MCV: 94.5 fL (ref 80.0–100.0)
Platelets: 239 10*3/uL (ref 150–400)
RBC: 4.57 MIL/uL (ref 3.87–5.11)
RDW: 12.8 % (ref 11.5–15.5)
WBC: 12.2 10*3/uL — ABNORMAL HIGH (ref 4.0–10.5)
nRBC: 0 % (ref 0.0–0.2)

## 2020-03-29 LAB — URINALYSIS, ROUTINE W REFLEX MICROSCOPIC
Bacteria, UA: NONE SEEN
Bilirubin Urine: NEGATIVE
Glucose, UA: >=500 mg/dL — AB
Ketones, ur: 80 mg/dL — AB
Leukocytes,Ua: NEGATIVE
Nitrite: NEGATIVE
Protein, ur: 30 mg/dL — AB
RBC / HPF: >50 RBC/hpf — ABNORMAL HIGH (ref 0–5)
Specific Gravity, Urine: 1.021 (ref 1.005–1.030)
pH: 6 (ref 5.0–8.0)

## 2020-03-29 LAB — RAPID URINE DRUG SCREEN, HOSP PERFORMED
Amphetamines: NOT DETECTED
Barbiturates: NOT DETECTED
Benzodiazepines: NOT DETECTED
Cocaine: NOT DETECTED
Opiates: NOT DETECTED
Tetrahydrocannabinol: POSITIVE — AB

## 2020-03-29 LAB — CBG MONITORING, ED
Glucose-Capillary: 415 mg/dL — ABNORMAL HIGH (ref 70–99)
Glucose-Capillary: 338 mg/dL — ABNORMAL HIGH (ref 70–99)
Glucose-Capillary: 422 mg/dL — ABNORMAL HIGH (ref 70–99)
Glucose-Capillary: 223 mg/dL — ABNORMAL HIGH (ref 70–99)
Glucose-Capillary: 170 mg/dL — ABNORMAL HIGH (ref 70–99)
Glucose-Capillary: 165 mg/dL — ABNORMAL HIGH (ref 70–99)
Glucose-Capillary: 136 mg/dL — ABNORMAL HIGH (ref 70–99)

## 2020-03-29 LAB — COMPREHENSIVE METABOLIC PANEL
ALT: 22 U/L (ref 0–44)
AST: 36 U/L (ref 15–41)
Albumin: 4.6 g/dL (ref 3.5–5.0)
Alkaline Phosphatase: 87 U/L (ref 38–126)
Anion gap: 18 — ABNORMAL HIGH (ref 5–15)
BUN: 15 mg/dL (ref 6–20)
CO2: 18 mmol/L — ABNORMAL LOW (ref 22–32)
Calcium: 9.2 mg/dL (ref 8.9–10.3)
Chloride: 101 mmol/L (ref 98–111)
Creatinine, Ser: 0.75 mg/dL (ref 0.44–1.00)
GFR calc Af Amer: >60 mL/min (ref >60)
GFR calc non Af Amer: >60 mL/min (ref >60)
Glucose, Bld: 318 mg/dL — ABNORMAL HIGH (ref 70–99)
Potassium: 4.8 mmol/L (ref 3.5–5.1)
Sodium: 137 mmol/L (ref 135–145)
Total Bilirubin: 1.2 mg/dL (ref 0.3–1.2)
Total Protein: 7.7 g/dL (ref 6.5–8.1)

## 2020-03-29 LAB — SARS CORONAVIRUS 2 BY RT PCR (HOSPITAL ORDER, PERFORMED IN ~~LOC~~ HOSPITAL LAB): SARS Coronavirus 2: NEGATIVE

## 2020-03-29 LAB — POC URINE PREG, ED: Preg Test, Ur: NEGATIVE

## 2020-03-29 LAB — BLOOD GAS, VENOUS
Acid-base deficit: 12.1 mmol/L — ABNORMAL HIGH (ref 0.0–2.0)
Bicarbonate: 15 mmol/L — ABNORMAL LOW (ref 20.0–28.0)
FIO2: 21
O2 Saturation: 71.9 %
Patient temperature: 36.6
pCO2, Ven: 30.6 mmHg — ABNORMAL LOW (ref 44.0–60.0)
pH, Ven: 7.268 (ref 7.250–7.430)
pO2, Ven: 50.4 mmHg — ABNORMAL HIGH (ref 32.0–45.0)

## 2020-03-29 LAB — GLUCOSE, CAPILLARY
Glucose-Capillary: 125 mg/dL — ABNORMAL HIGH (ref 70–99)
Glucose-Capillary: 127 mg/dL — ABNORMAL HIGH (ref 70–99)
Glucose-Capillary: 134 mg/dL — ABNORMAL HIGH (ref 70–99)

## 2020-03-29 LAB — LIPASE, BLOOD: Lipase: 14 U/L (ref 11–51)

## 2020-03-29 LAB — BETA-HYDROXYBUTYRIC ACID
Beta-Hydroxybutyric Acid: 7.14 mmol/L — ABNORMAL HIGH (ref 0.05–0.27)
Beta-Hydroxybutyric Acid: 6 mmol/L — ABNORMAL HIGH (ref 0.05–0.27)

## 2020-03-29 MED ORDER — DEXTROSE-NACL 5-0.45 % IV SOLN: INTRAVENOUS | Status: DC

## 2020-03-29 MED ORDER — SODIUM CHLORIDE 0.9 % IV BOLUS: 1000.0000 mL | Freq: Once | INTRAVENOUS | Status: DC

## 2020-03-29 MED ORDER — DEXTROSE 50 % IV SOLN
0.0000 mL | INTRAVENOUS | Status: DC | PRN
  Administered 2020-03-30: 20 mL via INTRAVENOUS
  Filled 2020-03-29: qty 50

## 2020-03-29 MED ORDER — IOHEXOL 300 MG/ML  SOLN
75.0000 mL | Freq: Once | INTRAMUSCULAR | Status: AC | PRN
  Administered 2020-03-29: 75 mL via INTRAVENOUS

## 2020-03-29 MED ORDER — SODIUM CHLORIDE 0.9 % IV SOLN: INTRAVENOUS | Status: DC

## 2020-03-29 MED ORDER — SODIUM CHLORIDE 0.9 % IV BOLUS
1000.0000 mL | INTRAVENOUS | Status: AC
  Administered 2020-03-29 (×2): 1000 mL via INTRAVENOUS

## 2020-03-29 MED ORDER — INSULIN REGULAR(HUMAN) IN NACL 100-0.9 UT/100ML-% IV SOLN
INTRAVENOUS | Status: AC
  Administered 2020-03-29: 7.5 [IU]/h via INTRAVENOUS
  Filled 2020-03-29: qty 100

## 2020-03-29 MED ORDER — INSULIN REGULAR(HUMAN) IN NACL 100-0.9 UT/100ML-% IV SOLN: INTRAVENOUS | Status: DC

## 2020-03-29 MED ORDER — ONDANSETRON HCL 4 MG/2ML IJ SOLN
4.0000 mg | Freq: Once | INTRAMUSCULAR | Status: AC
  Administered 2020-03-29: 4 mg via INTRAVENOUS
  Filled 2020-03-29: qty 2

## 2020-03-29 MED ORDER — ENOXAPARIN SODIUM 40 MG/0.4ML ~~LOC~~ SOLN: 40.0000 mg | SUBCUTANEOUS | Status: DC

## 2020-03-29 MED ORDER — ACETAMINOPHEN 325 MG PO TABS
650.0000 mg | ORAL_TABLET | Freq: Four times a day (QID) | ORAL | Status: DC | PRN
  Administered 2020-03-30: 20:00:00 650 mg via ORAL
  Filled 2020-03-29: qty 2

## 2020-03-29 MED ORDER — DEXTROSE 50 % IV SOLN
0.0000 mL | INTRAVENOUS | Status: DC | PRN
Start: 2020-03-29 — End: 2020-03-29

## 2020-03-29 MED ORDER — PANTOPRAZOLE SODIUM 40 MG PO TBEC
40.0000 mg | DELAYED_RELEASE_TABLET | Freq: Every day | ORAL | Status: DC
  Administered 2020-03-29 – 2020-03-31 (×3): 40 mg via ORAL
  Filled 2020-03-29 (×3): qty 1

## 2020-03-29 MED ORDER — POTASSIUM CHLORIDE 10 MEQ/100ML IV SOLN
10.0000 meq | INTRAVENOUS | Status: DC
  Administered 2020-03-29 (×2): 10 meq via INTRAVENOUS
  Filled 2020-03-29 (×2): qty 100

## 2020-03-29 NOTE — ED Triage Notes (Signed)
Abdominal pain with vomiting, seen at [facility] yesterday for same

## 2020-03-29 NOTE — H&P (Deleted)
[facility] History & Physical  Kelly Splinter MD  Malka So 03/29/2020  Chief Complaint: Dr Ashok Cordia HPI: The patient is a 24 y.o. year-old w/ hx of IDDM, hypothyroid and von willebrand disease type 1 presented to ED w/ c/o abd pain in epigastric region and vomiting x 1--2 days.  Pt was seen by [facility] yesterday for same symptoms. In ED labs showed suspected DKA w/ met acidosis, ^^glu 300- 450 and ^'d anion gap w/ +beta-hydroxybutyric acid. Pt was started on IV insulin protocol for DKA. Asked to see for admission.   Pt had DM1 at age 64. In her teen years states she had poor DM control and had 3 surgeries for infection on her feet then started to do better w/ her DM control for the last year or so.  Now she has am BS around 200- 300 and daytime BS's in 100- 200 range which is better. She takes lantus at night which was just lowered to 8units, and she takes SSI 4-5 times per day per the formula > BS -130 / 35.   Pt states that BS typically gets worse with her period.  She also frequently has N/V with her period.  Her period started 2 days ago and she started vomiting yesterday w/ L sided abd pain (mild).  She had 2 diarrheal stools yest and about 2 today. Today she cont'd to vomit and came to ED.  No bloody stools, no hx IBD or chronic bowel/ diarrhea issues.   ROS  denies CP  no joint pain   no HA  no blurry vision  no rash  no diarrhea  no nausea/ vomiting  no dysuria  no difficulty voiding  no change in urine color    Past Medical History  Past Medical History:  Diagnosis Date  . Allergy   . Arthritis    neck  . Cataract    bilateral - MD monitoring cataracts  . CHF (congestive heart failure) ([place])   . Chronic kidney disease, stage I    DR OTTELIN  HX UTIS  . Cirrhosis ([place])   . Cramp of limb   . Diabetes mellitus   . Dysphagia, unspecified(787.20)   . Dysuria   . Epistaxis   . GERD (gastroesophageal reflux disease)   . Heart murmur    NO CARDIOLOGIST   DX FOR YEARS ASYMPTOMATIC  . Lumbago   . Neoplasm of uncertain behavior of skin   . Nonspecific elevation of levels of transaminase or lactic acid dehydrogenase (LDH)   . Osteoarthrosis, unspecified whether generalized or localized, unspecified site   . Other and unspecified hyperlipidemia    diet controlled  . Pain in joint, shoulder region   . Paresthesias 10/13/2015  . Postablative ovarian failure   . Trochanteric bursitis of left hip 06/27/2016  . Type 2 diabetes mellitus without complication ([place])   . Unspecified essential hypertension    no meds   Past Surgical History  Past Surgical History:  Procedure Laterality Date  . BREAST BIOPSY    . CARDIAC CATHETERIZATION N/A 08/09/2016   Procedure: Left Heart Cath and Coronary Angiography;  Surgeon: Belva Crome, MD;  Location: [place] [facility];  Service: Cardiovascular;  Laterality: N/A;  . COLONOSCOPY  2012   Dr Lajoyce Corners.   . COLONOSCOPY WITH PROPOFOL N/A 01/18/2017   Procedure: COLONOSCOPY WITH PROPOFOL;  Surgeon: Milus Banister, MD;  Location: [facility];  Service: Endoscopy;  Laterality: N/A;  . CORONARY ARTERY BYPASS GRAFT N/A 08/10/2016  Procedure: CORONARY ARTERY BYPASS GRAFTING (CABG) x 3 USING RIGHT LEG GREATER SAPHENOUS VEIN GRAFT;  Surgeon: Melrose Nakayama, MD;  Location: [place];  Service: Open Heart Surgery;  Laterality: N/A;  . ENDOVEIN HARVEST OF GREATER SAPHENOUS VEIN Right 08/10/2016   Procedure: ENDOVEIN HARVEST OF GREATER SAPHENOUS VEIN;  Surgeon: Melrose Nakayama, MD;  Location: [place];  Service: Open Heart Surgery;  Laterality: Right;  . ESOPHAGEAL BANDING  10/09/2019   Procedure: ESOPHAGEAL BANDING;  Surgeon: Milus Banister, MD;  Location: [facility];  Service: Endoscopy;;  . ESOPHAGEAL BANDING  10/19/2019   Procedure: ESOPHAGEAL BANDING;  Surgeon: Juanita Craver, MD;  Location: [facility] ENDOSCOPY;  Service: Endoscopy;;  . ESOPHAGOGASTRODUODENOSCOPY N/A 10/19/2019   Procedure: ESOPHAGOGASTRODUODENOSCOPY (EGD);   Surgeon: Juanita Craver, MD;  Location: [facility] ENDOSCOPY;  Service: Endoscopy;  Laterality: N/A;  . ESOPHAGOGASTRODUODENOSCOPY (EGD) WITH PROPOFOL N/A 01/18/2017   Procedure: ESOPHAGOGASTRODUODENOSCOPY (EGD) WITH PROPOFOL;  Surgeon: Milus Banister, MD;  Location: [facility];  Service: Endoscopy;  Laterality: N/A;  . ESOPHAGOGASTRODUODENOSCOPY (EGD) WITH PROPOFOL N/A 10/09/2019   Procedure: ESOPHAGOGASTRODUODENOSCOPY (EGD) WITH PROPOFOL;  Surgeon: Milus Banister, MD;  Location: [facility];  Service: Endoscopy;  Laterality: N/A;  . HEMOSTASIS CLIP PLACEMENT  10/19/2019   Procedure: HEMOSTASIS CLIP PLACEMENT;  Surgeon: Juanita Craver, MD;  Location: [place] END[facility];  Service: Endoscopy;;  . IR ANGIOGRAM SELECTIVE EACH ADDITIONAL VESSEL  10/20/2019  . IR EMBO ART  VEN HEMORR LYMPH EXTRAV  INC GUIDE ROADMAPPING  10/20/2019  . IR PARACENTESIS  10/20/2019  . IR TIPS  10/20/2019  . MAXIMUM ACCESS (MAS)POSTERIOR LUMBAR INTERBODY FUSION (PLIF) 1 LEVEL Left 12/22/2015   Procedure: FOR MAXIMUM ACCESS (MAS) POSTERIOR LUMBAR INTERBODY FUSION (PLIF) LUMBAR THREE-FOUR EXTRAFORAMINAL MICRODISCECTOMY LUMBAR FIVE-SACRAL ONE LEFT;  Surgeon: Eustace Moore, MD;  Location: [place] [facility];  Service: Neurosurgery;  Laterality: Left;  . RADIOLOGY WITH ANESTHESIA N/A 10/20/2019   Procedure: RADIOLOGY WITH ANESTHESIA;  Surgeon: Radiologist, Medication, MD;  Location: [place];  Service: Radiology;  Laterality: N/A;  . SCLEROTHERAPY  10/19/2019   Procedure: SCLEROTHERAPY;  Surgeon: Juanita Craver, MD;  Location: [facility] ENDOSCOPY;  Service: Endoscopy;;  . TEE WITHOUT CARDIOVERSION N/A 08/10/2016   Procedure: TRANSESOPHAGEAL ECHOCARDIOGRAM (TEE);  Surgeon: Melrose Nakayama, MD;  Location: [place];  Service: Open Heart Surgery;  Laterality: N/A;  . TUBAL LIGATION  1982   Dr Connye Burkitt  . UPPER GASTROINTESTINAL ENDOSCOPY    . VAGINAL HYSTERECTOMY  1997   Dr Rande Lawman   Family History  Family History  Problem Relation Age of Onset  . Lung cancer  Father   . Arthritis Sister   . Arthritis Brother   . Heart disease Maternal Grandmother   . Heart disease Maternal Grandfather   . Heart disease Paternal Grandmother   . Heart disease Paternal Grandfather   . Breast cancer Mother   . Liver cancer Brother   . Breast cancer Maternal Aunt   . Breast cancer Paternal Aunt   . Colon cancer Neg Hx   . Esophageal cancer Neg Hx   . Rectal cancer Neg Hx   . Stomach cancer Neg Hx    Social History  reports that she has never smoked. She has never used smokeless tobacco. She reports that she does not drink alcohol or use drugs. Allergies  Allergies  Allergen Reactions  . Kiwi Extract Anaphylaxis  . Tdap [Tetanus-Diphth-Acell Pertussis] Swelling and Other (See Comments)    Swelling at injection site, gets very hot  . Statins  RHABDOMYOLYSIS  . Latex Itching, Dermatitis and Rash  . Tramadol Nausea And Vomiting   Home medications Prior to Admission medications   Medication Sig Start Date End Date Taking? Authorizing Provider  acetaminophen (TYLENOL) 500 MG tablet Take 500 mg by mouth at bedtime.     [provider]  Aromatic Inhalants (VICKS VAPOR IN) Vicks Vapor Rub apply small amount to outside of nose to help breathing    [provider]  BD PEN NEEDLE NANO U/F 32G X 4 MM MISC USE THREE TIMES DAILY AS DIRECTED 06/09/19   Reed, Tiffany L, DO  Biotin 10000 MCG TABS Take 10,000 mcg by mouth every morning.    [provider]  bisacodyl (DULCOLAX) 10 MG suppository Place 1 suppository (10 mg total) rectally daily as needed for moderate constipation. 10/24/19   Aline August, MD  calcium carbonate (OS-CAL) 600 MG TABS Take 600 mg by mouth 2 (two) times daily with a meal.      [provider]  Cholecalciferol (VITAMIN D) 50 MCG (2000 UT) CAPS Take 2,000 Units by mouth daily.     [provider]  Cyanocobalamin (VITAMIN B 12 PO) Take 1,000 mcg by mouth daily.      [provider]  ezetimibe  (ZETIA) 10 MG tablet TAKE 1 TABLET(10 MG) BY MOUTH DAILY 07/09/19   Reed, Tiffany L, DO  furosemide (LASIX) 40 MG tablet Take 40 mg by mouth daily.     [provider]  glucose blood test strip One Touch Ultra II strips. Use to test blood sugar three times daily. Dx: E11.65 12/13/17   Reed, Tiffany L, DO  insulin detemir (LEVEMIR) 100 UNIT/ML injection Inject 0.2 mLs (20 Units total) into the skin at bedtime. 10/25/19   Aline August, MD  Insulin Syringe-Needle U-100 (INSULIN SYRINGE 1CC/31GX5/16") 31G X 5/16" 1 ML MISC USE AS DIRECTED DAILY WITH LEVEMIR 02/07/19   Reed, Tiffany L, DO  JARDIANCE 25 MG TABS tablet Take 25 mg by mouth daily. 11/11/19   [provider]  lactulose (CHRONULAC) 10 GM/15ML solution Take 20 g by mouth 3 (three) times daily. 11/15/19   [provider]  loratadine (CLARITIN) 10 MG tablet Take 10 mg by mouth daily as needed for allergies.    [provider]  MAGNESIUM PO Take 500 mg by mouth 2 (two) times daily in the am and at bedtime..    [provider]  Multiple Vitamins-Minerals (MULTIVITAMIN WITH MINERALS) tablet Take 1 tablet by mouth daily.      [provider]  NOVOLOG FLEXPEN 100 UNIT/ML FlexPen Inject 12 units under the skin every morning, 8 units at lunch and 12 units at supper 09/02/19   Reed, Tiffany L, DO  ondansetron (ZOFRAN) 4 MG tablet Take 1 tablet (4 mg total) by mouth every 6 (six) hours as needed for nausea. 10/24/19   Aline August, MD  pantoprazole (PROTONIX) 40 MG tablet Take 1 tablet (40 mg total) by mouth 2 (two) times daily. 10/24/19   Aline August, MD  Polyethyl Glycol-Propyl Glycol (SYSTANE OP) Place 1 drop into both eyes 2 (two) times daily.    [provider]  Probiotic Product (PROBIOTIC DAILY PO) Take 1 capsule by mouth daily. Digestive Advantage Probiotic    [provider]  spironolactone (ALDACTONE) 50 MG tablet Take 1 tablet (50 mg total) by mouth 2 (two) times daily.  10/27/19   Gayland Curry, DO   Liver Function Tests Recent Labs  Lab 11/24/19 1436 11/26/19  1042  AST 58* 62*  ALT 41* 45*  ALKPHOS  --  127*  BILITOT 3.5* 3.4*  PROT 7.8 7.2  ALBUMIN  --  3.0*   Recent Labs  Lab 11/26/19 1042  LIPASE 29   CBC Recent Labs  Lab 11/24/19 1436 11/26/19 1042 11/26/19 1056  WBC 10.4 7.7  --   NEUTROABS 8,299* 5.9  --   HGB 16.1* 14.9 15.0  HCT 47.2* 43.4 44.0  MCV 92.5 94.6  --   PLT 151 116*  --    Basic Metabolic Panel Recent Labs  Lab 11/24/19 1436 11/26/19 1042 11/26/19 1056  NA 133* 131* 133*  K 4.5 4.6 4.6  CL 96* 97*  --   CO2 24 23  --   GLUCOSE 269* 138*  --   BUN 19 20  --   CREATININE 1.05* 1.13*  --   CALCIUM 11.1* 10.0  --    Iron/TIBC/Ferritin/ %Sat    Component Value Date/Time   IRON 29 11/18/2016 1422   TIBC 427 11/18/2016 1422   FERRITIN 13 11/18/2016 1422   IRONPCTSAT 7 (L) 11/18/2016 1422    Vitals:   11/26/19 1018 11/26/19 1030 11/26/19 1045 11/26/19 1215  BP: (!) 123/51 (!) 101/46  (!) 125/54  Pulse: 89 83    Resp: '16 15  20  ' Temp: 97.8 F (36.6 C)  97.9 F (36.6 C)   TempSrc: Oral  Rectal   SpO2: 99% 98%      Exam Gen alert small framed pleaseant WF no distress, calm No rash, cyanosis or gangrene Sclera anicteric, throat clear  No jvd or bruits Chest clear bilat to bases no rales, wheezing or bronchial BS RRR no MRG Abd soft ntnd no mass or ascites +bs, no rebound or guarding GU  defer MS no joint effusions or deformity Ext no LE or UE edema, no wounds or ulcers Neuro is alert, Ox 3 , nf    Home meds:  - lantus 10u hs/ novolog 1-7 u tid ac  - loestrin 1 qd  - prn's/ PPI     1213 pm  -- Na 137  K 4.8  CO2 18 AG 18  Glu 318      1445 pm  --- Na 135  K 5.1  CO2 12  AG > 20  Glu 447  Creat 0.78      IV reg insulin gtt started at 2:45p - 3:47 pm this afternoon    SP bolus 2 L NS      1445 pm -- 7.26/ 30/ 50 venous gas      WBC 12k Hb14  plt 239  Beat-hydroxy = 7.14 ^^       UA >  500 glu, large Hb, 80 ketone, 30 prot , >50 rbc, no wbc        CT abd 5/10 > IMPRESSION: 1. Signs of colitis involving the ascending colon and potentially transverse colon, findings are nonspecific, potentially infectious or inflammatory. Enteritis was questioned on the previous exam. Correlate with any history of repeated abdominal pain or recent antibiotic administration. 2. Gas within the soft tissues of the RIGHT gluteal region is of uncertain significance, potentially introduced during medication administration or injection. Correlate with any injection in this area or signs of infection. Small amount of subcutaneous gas along the LEFT gluteal region remains in subcutaneous soft tissues more compatible with medication administration.  Assessment/ Plan: 1. DM1 with DKA - pt admitted and started on IV protocol for DKA. Pt  feeling better.  Admitted to ICU, follow protocol.  Prob trigger may be N/V related to her menses and/or possible "colitis" noted on CT abd.  2. Abd pain - LUQ/LLQ per pt, minimal exam findings.  Possible "colitis" of ascend colon per CT today. There is mild ^WBC, but no fever. UA shows rbc's only (menses), CXR clear.  Follow symptoms, send GI pathogen panel.  3. H/o von willebrand's type 1 4. Hypothyroid      Kelly Splinter, MD   [facility] 11/26/2019, 1:10 PM

## 2020-03-29 NOTE — ED Provider Notes (Signed)
[facility]   CSN: [id number] Arrival date & time: 03/29/20  1115     History Chief Complaint  Patient presents with  . Abdominal Pain    Tammy CageShannon Higgins is a 24 y.o. female with pertinent past medical history of diabetes type 1, von Willebrand's disease, hypothyroidism that presents emergency department today for nausea, vomiting, diarrhea, abdominal pain that began yesterday.  [facility] for the same and they discharged her.  Patient states that her symptoms are becoming worse, denies any hematemesis or coffee-ground emesis.  Patient states that she cannot keep anything down including fluids.  Patient states that she has been having multiple episodes of diarrhea since yesterday as well, denies any hematochezia or melena.  Patient states that her pain in her abdomen is epigastric, states that it occurs when she vomits.  Also admits to regurgitation now and in the past.  Patient has never been diagnosed with GERD.  Patient states that she has been smoking marijuana, denies any alcohol or other illicit drug use.  Patient denies any sick contacts denies any chance of pregnancy.  Patient states that she has been in DKA before.  Patient states that she has been using her insulin as she is supposed to.  Denies any change in medications.  Denies any shortness breath, back pain, chest pain, headache, vision changes, gait abnormalities, swelling, weakness, paresthesias, fevers.  Patient admits to some mild chills, has not taken temperature at home.  Patient was actively vomiting while I was in the room, bilious vomit.  Patient denies any hematuria, dysuria,.  Patient states that she is on on her period for the past 2 days.  HPI     Past Medical History:  Diagnosis Date  . Diabetes mellitus without complication (HCC)   . Hypothyroidism   . Von Willebrand disease, type I [facility](HCC)     Patient Active Problem List   Diagnosis Date  Noted  . DKA (diabetic ketoacidoses) (HCC) 03/29/2020  . Emesis, persistent 02/29/2020  . Diabetes mellitus type 1, uncontrolled (HCC) 02/29/2020  . Von Willebrand disease (HCC) 02/29/2020  . Gastroenteritis 02/29/2020  . Cannabis abuse 02/29/2020  . Cannabis hyperemesis syndrome concurrent with and due to cannabis abuse (HCC) 02/29/2020  . UTI (urinary tract infection) 02/29/2020  . Pyelonephritis 02/29/2020    Past Surgical History:  Procedure Laterality Date  . INCISION AND DRAINAGE FOOT Left   . TONSILLECTOMY       OB History   No obstetric history on file.     No family history on file.  Social History   Tobacco Use  . Smoking status: Never Smoker  . Smokeless tobacco: Never Used  Substance Use Topics  . Alcohol use: Yes    Comment: occasionally  . Drug use: Yes    Types: Marijuana    Home Medications Prior to Admission medications   Medication Sig Start Date End Date Taking? Authorizing Provider  insulin aspart (NOVOLOG) 100 UNIT/ML injection Inject 1-7 Units into the skin 3 (three) times daily before meals. Uses according to a sliding scale at home.    Yes [provider]  insulin glargine (LANTUS) 100 UNIT/ML injection Inject 10 Units into the skin at bedtime.    Yes [provider]  norethindrone-ethinyl estradiol (LOESTRIN) 1-20 MG-MCG tablet Take 1 tablet by mouth daily. 03/23/20  Yes [provider]  omeprazole (PRILOSEC) 20 MG capsule Take 1 capsule (20 mg total) by mouth daily. 03/01/20 03/01/21  Yes Emokpae, Courage, MD  ondansetron (ZOFRAN) 4 MG tablet Take 1 tablet (4 mg total) by mouth every 8 (eight) hours as needed for nausea or vomiting. 03/01/20  Yes Emokpae, Courage, MD  promethazine (PHENERGAN) 25 MG tablet Take 1 tablet (25 mg total) by mouth every 8 (eight) hours as needed for nausea or vomiting. 03/01/20  Yes Emokpae, Courage, MD  acetaminophen (TYLENOL) 500 MG tablet Take 1,000 mg by mouth every 6 (six) hours as needed.     [provider]  capsaicin (ZOSTRIX) 0.025 % cream Apply topically 2 (two) times daily. Apply sparingly to skin twice a day--- if nausea/vomiting Patient not taking: Reported on 03/29/2020 03/01/20   Roxan Hockey, MD    Allergies    Patient has no known allergies.  Review of Systems   Review of Systems  Constitutional: Negative for chills, diaphoresis, fatigue and fever.  HENT: Negative for congestion, sore throat and trouble swallowing.   Eyes: Negative for pain and visual disturbance.  Respiratory: Negative for cough, shortness of breath and wheezing.   Cardiovascular: Negative for chest pain, palpitations and leg swelling.  Gastrointestinal: Positive for abdominal pain, diarrhea, nausea and vomiting. Negative for abdominal distention.  Genitourinary: Negative for difficulty urinating.  Musculoskeletal: Negative for back pain, neck pain and neck stiffness.  Skin: Negative for pallor.  Neurological: Negative for dizziness, speech difficulty, weakness and headaches.  Psychiatric/Behavioral: Negative for confusion.    Physical Exam Updated Vital Signs BP (!) 143/80   Pulse (!) 120   Temp 98 F (36.7 C) (Oral)   Resp 20   Ht 5\' 3"  (1.6 m)   Wt 49.9 kg   LMP 03/27/2020   SpO2 100%   BMI 19.49 kg/m   Physical Exam Constitutional:      General: She is not in acute distress.    Appearance: Normal appearance. She is not ill-appearing, toxic-appearing or diaphoretic.  HENT:     Mouth/Throat:     Mouth: Mucous membranes are dry.     Pharynx: Oropharynx is clear.  Eyes:     General: No scleral icterus.    Extraocular Movements: Extraocular movements intact.     Pupils: Pupils are equal, round, and reactive to light.  Cardiovascular:     Rate and Rhythm: Normal rate and regular rhythm.     Pulses: Normal pulses.     Heart sounds: Normal heart sounds.  Pulmonary:     Effort: Pulmonary effort is normal. No respiratory distress.     Breath sounds: Normal breath  sounds. No stridor. No wheezing, rhonchi or rales.  Chest:     Chest wall: No tenderness.  Abdominal:     General: Abdomen is flat. There is no distension.     Palpations: Abdomen is soft.     Tenderness: There is abdominal tenderness in the epigastric area. There is no guarding or rebound. Negative signs include Murphy's sign, Rovsing's sign, McBurney's sign and obturator sign.  Musculoskeletal:        General: No swelling or tenderness. Normal range of motion.     Cervical back: Normal range of motion and neck supple. No rigidity.     Right lower leg: No edema.     Left lower leg: No edema.  Skin:    General: Skin is warm and dry.     Capillary Refill: Capillary refill takes less than 2 seconds.     Coloration: Skin is not pale.  Neurological:     General: No focal deficit present.  Mental Status: She is alert and oriented to person, place, and time.  Psychiatric:        Mood and Affect: Mood normal.        Behavior: Behavior normal.     ED Results / Procedures / Treatments   Labs (all labs ordered are listed, but only abnormal results are displayed) Labs Reviewed  COMPREHENSIVE METABOLIC PANEL - Abnormal; Notable for the following components:      Result Value   CO2 18 (*)    Glucose, Bld 318 (*)    Anion gap 18 (*)    All other components within normal limits  CBC - Abnormal; Notable for the following components:   WBC 12.2 (*)    All other components within normal limits  URINALYSIS, ROUTINE W REFLEX MICROSCOPIC - Abnormal; Notable for the following components:   Glucose, UA >=500 (*)    Hgb urine dipstick LARGE (*)    Ketones, ur 80 (*)    Protein, ur 30 (*)    RBC / HPF >50 (*)    All other components within normal limits  BETA-HYDROXYBUTYRIC ACID - Abnormal; Notable for the following components:   Beta-Hydroxybutyric Acid 7.14 (*)    All other components within normal limits  BLOOD GAS, VENOUS - Abnormal; Notable for the following components:   pCO2, Ven  30.6 (*)    pO2, Ven 50.4 (*)    Bicarbonate 15.0 (*)    Acid-base deficit 12.1 (*)    All other components within normal limits  BASIC METABOLIC PANEL - Abnormal; Notable for the following components:   CO2 12 (*)    Glucose, Bld 447 (*)    Anion gap >20 (*)    All other components within normal limits  CBG MONITORING, ED - Abnormal; Notable for the following components:   Glucose-Capillary 415 (*)    All other components within normal limits  CBG MONITORING, ED - Abnormal; Notable for the following components:   Glucose-Capillary 422 (*)    All other components within normal limits  CBG MONITORING, ED - Abnormal; Notable for the following components:   Glucose-Capillary 338 (*)    All other components within normal limits  CBG MONITORING, ED - Abnormal; Notable for the following components:   Glucose-Capillary 223 (*)    All other components within normal limits  SARS CORONAVIRUS 2 BY RT PCR (HOSPITAL ORDER, PERFORMED IN [facility])  LIPASE, BLOOD  RAPID URINE DRUG SCREEN, HOSP PERFORMED  HIV ANTIBODY (ROUTINE TESTING W REFLEX)  BASIC METABOLIC PANEL  BASIC METABOLIC PANEL  BASIC METABOLIC PANEL  BASIC METABOLIC PANEL  BETA-HYDROXYBUTYRIC ACID  BETA-HYDROXYBUTYRIC ACID  POC URINE PREG, ED    EKG EKG Interpretation  Date/Time:  Monday Mar 29 2020 14:55:31 EDT Ventricular Rate:  114 PR Interval:    QRS Duration: 89 QT Interval:  342 QTC Calculation: 471 R Axis:   59 Text Interpretation: Sinus tachycardia Confirmed by Cathren Laine ([id number]) on 03/29/2020 2:59:09 PM   Radiology CT Abdomen Pelvis W Contrast  Result Date: 03/29/2020 CLINICAL DATA:  Abdominal pain, unspecified. EXAM: CT ABDOMEN AND PELVIS WITH CONTRAST TECHNIQUE: Multidetector CT imaging of the abdomen and pelvis was performed using the standard protocol following bolus administration of intravenous contrast. CONTRAST:  72mL OMNIPAQUE IOHEXOL 300 MG/ML  SOLN COMPARISON:  Prior CT from  02/29/2020, history of pyelonephritis FINDINGS: Lower chest: Incidental imaging of the lung bases is unremarkable. No consolidation or evidence of pleural effusion. Hepatobiliary: Focal fat along the fissure  for falciform ligament. Presumed hepatic steatosis. Portal vein is patent. Gallbladder is normal. No biliary ductal dilation. Pancreas: Pancreas is normal without ductal dilation, inflammation or lesion. Spleen: Spleen is normal size without focal lesion. Adrenals/Urinary Tract: Adrenal glands are normal. Renal contours are smooth. No perinephric fluid. Urinary bladder is normal. No hydronephrosis. Stomach/Bowel: Stomach is normal. Small bowel is normal caliber. The appendix is normal. The ascending colon shows thickening and pericolonic stranding. No focal fluid. No pneumatosis. Process extends at least to the transverse colon. Descending colon and sigmoid appear spared at least radiographically. Vascular/Lymphatic: Patent abdominal vasculature. No adenopathy in the retroperitoneum or in the upper abdomen. SMV is patent. No signs of pelvic lymphadenopathy. Reproductive: No adnexal mass. Uterus unremarkable by CT. Other: No ascites. No free air. Musculoskeletal: No acute bone finding. No destructive bone process. Gas within the gluteal musculature along the RIGHT iliac crest and just below the iliac crest small amount of gas also in the soft tissues of the LEFT buttock and hip. IMPRESSION: 1. Signs of colitis involving the ascending colon and potentially transverse colon, findings are nonspecific, potentially infectious or inflammatory. Enteritis was questioned on the previous exam. Correlate with any history of repeated abdominal pain or recent antibiotic administration. 2. Gas within the soft tissues of the RIGHT gluteal region is of uncertain significance, potentially introduced during medication administration or injection. Correlate with any injection in this area or signs of infection. Small amount of  subcutaneous gas along the LEFT gluteal region remains in subcutaneous soft tissues more compatible with medication administration. 3. Normal appendix. 4. Hepatic steatosis. Electronically Signed   By: Donzetta Kohut M.D.   On: 03/29/2020 15:55    Procedures Procedures (including critical care time)  Medications Ordered in ED Medications  insulin regular, human (MYXREDLIN) 100 units/ 100 mL infusion (2.2 Units/hr Intravenous Rate/Dose Change 03/29/20 1722)  0.9 %  sodium chloride infusion ( Intravenous Stopped 03/29/20 1737)  dextrose 5 %-0.45 % sodium chloride infusion ( Intravenous New Bag/Given 03/29/20 1729)  dextrose 50 % solution 0-50 mL (has no administration in time range)  enoxaparin (LOVENOX) injection 40 mg (has no administration in time range)  ondansetron (ZOFRAN) injection 4 mg (4 mg Intravenous Given 03/29/20 1547)  sodium chloride 0.9 % bolus 1,000 mL (0 mLs Intravenous Stopped 03/29/20 1736)  iohexol (OMNIPAQUE) 300 MG/ML solution 75 mL (75 mLs Intravenous Contrast Given 03/29/20 1527)    ED Course  I have reviewed the triage vital signs and the nursing notes.  Pertinent labs & imaging results that were available during my care of the patient were reviewed by me and considered in my medical decision making (see chart for details).    MDM Rules/Calculators/A&P                     Glucose, Bld  Date Value Ref Range Status  03/29/2020 447 (H) 70 - 99 mg/dL Final    Comment:    Glucose reference range applies only to samples taken after fasting for at least 8 hours.  03/29/2020 318 (H) 70 - 99 mg/dL Final    Comment:    Glucose reference range applies only to samples taken after fasting for at least 8 hours.  03/01/2020 117 (H) 70 - 99 mg/dL Final    Comment:    Glucose reference range applies only to samples taken after fasting for at least 8 hours.  02/29/2020 331 (H) 70 - 99 mg/dL Final    Comment:    Glucose  reference range applies only to samples taken after fasting  for at least 8 hours.     Tammy Higgins is a 24 y.o. female with pertinent past medical history of diabetes type 1, von Willebrand's disease, hypothyroidism that presents emergency department today for nausea, vomiting, diarrhea, abdominal pain that began yesterday.  Labs show that she is in DKA, will initiate DKA order set.  Unsure what prompted DKA at this time.  Tenderness towards epigastrium, will get CT scan.  CT abdomen with signs of colitis.  Discussed this with patient and her mom who states that they think that patient had a GI bug.  Patient denies any recent antibiotic use.  CBC stable, white count 12.2, most likely reactive due to DKA.  Initial DKA labs with anion gap of 18, glucose of 318, urine with 80 ketones, large amounts of glucose and hemoglobin.  VBG shows pH of 7.268.  All consistent with DKA.  Repeat BMP with glucose 447, anion gap greater than 20.  Will consult hospitalist due to likely admission due to DKA.    Upon reassessment pt states that she feels much better with fluids, insulin, ect. Mom is now present in room and discussed plan for admission with her.   . 5:44 PM discussed case with hospitalist, Dr. Arlean Hopping, who agrees to accept care of patient.   The patient appears reasonably stabilized for admission considering the current resources, flow, and capabilities available in the ED at this time, and I doubt any other [facility] requiring further screening and/or treatment in the ED prior to admission.  I discussed this case with my attending physician, Dr. Denton Lank, who cosigned this note including patient's presenting symptoms, physical exam, and planned diagnostics and interventions. Attending physician stated agreement with plan or made changes to plan which were implemented.    Final Clinical Impression(s) / ED Diagnoses Final diagnoses:  DKA (diabetic ketoacidoses) [facility])    Rx / DC Orders ED Discharge Orders    None       Farrel Gordon, PA-C 03/30/20  0932    Cathren Laine, MD 03/30/20 1545

## 2020-03-29 NOTE — ED Notes (Signed)
Pt was informed that we need a urine sample. 

## 2020-03-30 LAB — BASIC METABOLIC PANEL
Anion gap: 11 (ref 5–15)
Anion gap: 12 (ref 5–15)
Anion gap: 16 — ABNORMAL HIGH (ref 5–15)
Anion gap: 8 (ref 5–15)
Anion gap: 10 (ref 5–15)
BUN: 11 mg/dL (ref 6–20)
BUN: 11 mg/dL (ref 6–20)
BUN: 10 mg/dL (ref 6–20)
BUN: 6 mg/dL (ref 6–20)
BUN: 5 mg/dL — ABNORMAL LOW (ref 6–20)
CO2: 18 mmol/L — ABNORMAL LOW (ref 22–32)
CO2: 17 mmol/L — ABNORMAL LOW (ref 22–32)
CO2: 16 mmol/L — ABNORMAL LOW (ref 22–32)
CO2: 21 mmol/L — ABNORMAL LOW (ref 22–32)
CO2: 20 mmol/L — ABNORMAL LOW (ref 22–32)
Calcium: 8.2 mg/dL — ABNORMAL LOW (ref 8.9–10.3)
Calcium: 8.2 mg/dL — ABNORMAL LOW (ref 8.9–10.3)
Calcium: 8.4 mg/dL — ABNORMAL LOW (ref 8.9–10.3)
Calcium: 8.2 mg/dL — ABNORMAL LOW (ref 8.9–10.3)
Calcium: 8.3 mg/dL — ABNORMAL LOW (ref 8.9–10.3)
Chloride: 108 mmol/L (ref 98–111)
Chloride: 107 mmol/L (ref 98–111)
Chloride: 105 mmol/L (ref 98–111)
Chloride: 108 mmol/L (ref 98–111)
Chloride: 106 mmol/L (ref 98–111)
Creatinine, Ser: 0.43 mg/dL — ABNORMAL LOW (ref 0.44–1.00)
Creatinine, Ser: 0.53 mg/dL (ref 0.44–1.00)
Creatinine, Ser: 0.52 mg/dL (ref 0.44–1.00)
Creatinine, Ser: 0.46 mg/dL (ref 0.44–1.00)
Creatinine, Ser: 0.49 mg/dL (ref 0.44–1.00)
GFR calc Af Amer: >60 mL/min (ref >60)
GFR calc Af Amer: >60 mL/min (ref >60)
GFR calc Af Amer: >60 mL/min (ref >60)
GFR calc Af Amer: >60 mL/min (ref >60)
GFR calc Af Amer: >60 mL/min (ref >60)
GFR calc non Af Amer: >60 mL/min (ref >60)
GFR calc non Af Amer: >60 mL/min (ref >60)
GFR calc non Af Amer: >60 mL/min (ref >60)
GFR calc non Af Amer: >60 mL/min (ref >60)
GFR calc non Af Amer: >60 mL/min (ref >60)
Glucose, Bld: 138 mg/dL — ABNORMAL HIGH (ref 70–99)
Glucose, Bld: 292 mg/dL — ABNORMAL HIGH (ref 70–99)
Glucose, Bld: 265 mg/dL — ABNORMAL HIGH (ref 70–99)
Glucose, Bld: 169 mg/dL — ABNORMAL HIGH (ref 70–99)
Glucose, Bld: 134 mg/dL — ABNORMAL HIGH (ref 70–99)
Potassium: 3.6 mmol/L (ref 3.5–5.1)
Potassium: 3.5 mmol/L (ref 3.5–5.1)
Potassium: 3.4 mmol/L — ABNORMAL LOW (ref 3.5–5.1)
Potassium: 3.4 mmol/L — ABNORMAL LOW (ref 3.5–5.1)
Potassium: 3.2 mmol/L — ABNORMAL LOW (ref 3.5–5.1)
Sodium: 137 mmol/L (ref 135–145)
Sodium: 136 mmol/L (ref 135–145)
Sodium: 137 mmol/L (ref 135–145)
Sodium: 137 mmol/L (ref 135–145)
Sodium: 136 mmol/L (ref 135–145)

## 2020-03-30 LAB — BETA-HYDROXYBUTYRIC ACID
Beta-Hydroxybutyric Acid: 2.16 mmol/L — ABNORMAL HIGH (ref 0.05–0.27)
Beta-Hydroxybutyric Acid: 4.48 mmol/L — ABNORMAL HIGH (ref 0.05–0.27)
Beta-Hydroxybutyric Acid: 1.01 mmol/L — ABNORMAL HIGH (ref 0.05–0.27)

## 2020-03-30 LAB — GLUCOSE, CAPILLARY
Glucose-Capillary: 115 mg/dL — ABNORMAL HIGH (ref 70–99)
Glucose-Capillary: 123 mg/dL — ABNORMAL HIGH (ref 70–99)
Glucose-Capillary: 132 mg/dL — ABNORMAL HIGH (ref 70–99)
Glucose-Capillary: 135 mg/dL — ABNORMAL HIGH (ref 70–99)
Glucose-Capillary: 147 mg/dL — ABNORMAL HIGH (ref 70–99)
Glucose-Capillary: 150 mg/dL — ABNORMAL HIGH (ref 70–99)
Glucose-Capillary: 162 mg/dL — ABNORMAL HIGH (ref 70–99)
Glucose-Capillary: 165 mg/dL — ABNORMAL HIGH (ref 70–99)
Glucose-Capillary: 167 mg/dL — ABNORMAL HIGH (ref 70–99)
Glucose-Capillary: 169 mg/dL — ABNORMAL HIGH (ref 70–99)
Glucose-Capillary: 202 mg/dL — ABNORMAL HIGH (ref 70–99)
Glucose-Capillary: 220 mg/dL — ABNORMAL HIGH (ref 70–99)
Glucose-Capillary: 223 mg/dL — ABNORMAL HIGH (ref 70–99)
Glucose-Capillary: 231 mg/dL — ABNORMAL HIGH (ref 70–99)
Glucose-Capillary: 244 mg/dL — ABNORMAL HIGH (ref 70–99)
Glucose-Capillary: 249 mg/dL — ABNORMAL HIGH (ref 70–99)
Glucose-Capillary: 273 mg/dL — ABNORMAL HIGH (ref 70–99)
Glucose-Capillary: 66 mg/dL — ABNORMAL LOW (ref 70–99)

## 2020-03-30 LAB — MRSA PCR SCREENING: MRSA by PCR: POSITIVE — AB

## 2020-03-30 MED ORDER — INSULIN ASPART 100 UNIT/ML ~~LOC~~ SOLN
0.0000 [IU] | Freq: Three times a day (TID) | SUBCUTANEOUS | Status: DC
  Administered 2020-03-31: 2 [IU] via SUBCUTANEOUS

## 2020-03-30 MED ORDER — PROMETHAZINE HCL 25 MG/ML IJ SOLN
25.0000 mg | Freq: Once | INTRAMUSCULAR | Status: AC
  Administered 2020-03-30: 19:00:00 25 mg via INTRAVENOUS
  Filled 2020-03-30: qty 1

## 2020-03-30 MED ORDER — POTASSIUM CHLORIDE CRYS ER 20 MEQ PO TBCR
40.0000 meq | EXTENDED_RELEASE_TABLET | ORAL | Status: AC
  Administered 2020-03-30 (×2): 40 meq via ORAL
  Filled 2020-03-30 (×3): qty 2

## 2020-03-30 MED ORDER — INSULIN GLARGINE 100 UNIT/ML ~~LOC~~ SOLN
10.0000 [IU] | Freq: Once | SUBCUTANEOUS | Status: AC
  Administered 2020-03-30: 19:00:00 10 [IU] via SUBCUTANEOUS
  Filled 2020-03-30: qty 0.1

## 2020-03-30 MED ORDER — ONDANSETRON HCL 4 MG/2ML IJ SOLN
4.0000 mg | Freq: Once | INTRAMUSCULAR | Status: AC
  Administered 2020-03-30: 04:00:00 4 mg via INTRAVENOUS
  Filled 2020-03-30: qty 2

## 2020-03-30 MED ORDER — INSULIN ASPART 100 UNIT/ML ~~LOC~~ SOLN: 0.0000 [IU] | Freq: Every day | SUBCUTANEOUS | Status: DC

## 2020-03-30 MED ORDER — INSULIN ASPART 100 UNIT/ML ~~LOC~~ SOLN
4.0000 [IU] | Freq: Three times a day (TID) | SUBCUTANEOUS | Status: DC
  Administered 2020-03-31 (×2): 4 [IU] via SUBCUTANEOUS

## 2020-03-30 MED ORDER — ONDANSETRON HCL 4 MG/2ML IJ SOLN
4.0000 mg | Freq: Four times a day (QID) | INTRAMUSCULAR | Status: DC | PRN
  Administered 2020-03-31: 08:00:00 4 mg via INTRAVENOUS
  Filled 2020-03-30: qty 2

## 2020-03-30 MED ORDER — SODIUM CHLORIDE 0.9 % IV SOLN: INTRAVENOUS | Status: DC

## 2020-03-30 MED ORDER — PROMETHAZINE HCL 25 MG/ML IJ SOLN
25.0000 mg | Freq: Once | INTRAMUSCULAR | Status: AC
  Administered 2020-03-30: 25 mg via INTRAVENOUS
  Filled 2020-03-30: qty 1

## 2020-03-30 MED ORDER — PROMETHAZINE HCL 25 MG/ML IJ SOLN
25.0000 mg | Freq: Once | INTRAMUSCULAR | Status: AC
  Administered 2020-03-30: 04:00:00 25 mg via INTRAVENOUS
  Filled 2020-03-30: qty 1

## 2020-03-30 MED ORDER — CHLORHEXIDINE GLUCONATE CLOTH 2 % EX PADS
6.0000 | MEDICATED_PAD | Freq: Every day | CUTANEOUS | Status: DC
  Administered 2020-03-30 – 2020-03-31 (×2): 6 via TOPICAL

## 2020-03-30 NOTE — Progress Notes (Signed)
Lantus given at 1840, MD stated to stop insulin gtt 2 hours after Lantus given. Will pass on to night shift RN to stop at 2040. Will continue to monitor.

## 2020-03-30 NOTE — Progress Notes (Signed)
Patient Demographics:    Tammy Higgins, is a 24 y.o. female, DOB - 1996/10/30, FOY:[id number]  Admit date - 03/29/2020   Admitting Physician Roney Jaffe, MD  Outpatient Primary MD for the patient is System, Pcp Not In  LOS - 1   Chief Complaint  Patient presents with  . Abdominal Pain        Subjective:    Tammy Higgins today has no fevers,   No chest pain,  --nausea vomiting improving with Phenergan and Zofran  Assessment  & Plan :    Active Problems:   Type 1 diabetes mellitus with ketoacidosis without coma (HCC)   Von Willebrand disease ([place])   Nausea with vomiting   Diarrhea   1)DM1--patient was diagnosed with type I DM at age 89, -   A1c 8.9 (02/29/20--reflecting uncontrolled DM PTA -DKA pathophysiology appears to be improving on IV insulin and IV fluids -Beta hydroxybutyric acid down to 1.0 from a peak of 7.1 -Anion gap is down to 8 from a peak of 17 -Bicarb is up to 21 from a low of 12 -Serum glucose is down to the 130s -Okay to transition to Lantus insulin 10 units nightly, stop Endo tool/IV insulin 2 hours after giving Lantus -Okay to use sliding scale insulin along with NovoLog insulin for meal coverage   2) Nausea and Emesis--- no  hematemesis Pt tells me she quit smoking marijuana since her last visit, -Lipase is not elevated -Suspect some degree of gastroparesis -Continue as needed antiemetics  3)Von Willebrand's disease--- stable, - outpatient follow-up with hematologist advised  4)Hypothyroidism--- recent TSH 4.0,    Disposition/Need for in-Hospital Stay- patient unable to be discharged at this time due to --DKA requiring IV fluids and IV insulin -Patient From: home D/C Place: home Barriers: Not Clinically Stable-   Code Status : full  Family Communication:   (patient is alert, awake and coherent) -Female friend at bedside  Consults  :  na  DVT  Prophylaxis  :  Lovenox - - SCDs   Lab Results  Component Value Date   PLT 239 03/29/2020    Inpatient Medications  Scheduled Meds: . Chlorhexidine Gluconate Cloth  6 each Topical Daily  . enoxaparin (LOVENOX) injection  40 mg Subcutaneous Q24H  . pantoprazole  40 mg Oral Daily  . potassium chloride  40 mEq Oral Q3H  . promethazine  25 mg Intravenous Once   Continuous Infusions: . sodium chloride Stopped (03/29/20 1737)  . dextrose 5 % and 0.45% NaCl 125 mL/hr at 03/30/20 1705  . insulin 0.3 Units/hr (03/30/20 1734)   PRN Meds:.acetaminophen, dextrose, ondansetron (ZOFRAN) IV    Anti-infectives (From admission, onward)   None        Objective:   Vitals:   03/30/20 1300 03/30/20 1500 03/30/20 1600 03/30/20 1700  BP: 126/70 129/74 121/60 134/75  Pulse: (!) 103 91 97 (!) 103  Resp: 17 (!) 21 16 (!) 0  Temp:      TempSrc:      SpO2: 98% 100% 100% 99%  Weight:      Height:        Wt Readings from Last 3 Encounters:  03/30/20 50.8 kg  02/29/20 52 kg  02/27/20 54.4 kg  Intake/Output Summary (Last 24 hours) at 03/30/2020 1820 Last data filed at 03/30/2020 1528 Gross per 24 hour  Intake 2241.47 ml  Output 1700 ml  Net 541.47 ml     Physical Exam  Gen:- Awake Alert,  In no apparent distress  HEENT:- [place].AT, No sclera icterus Neck-Supple Neck,No JVD,.  Lungs-  CTAB , fair symmetrical air movement CV- S1, S2 normal, regular  Abd-  +ve B.Sounds, Abd Soft, No tenderness,    Extremity/Skin:- No  edema, pedal pulses present  Psych-affect is appropriate, oriented x3 Neuro-no new focal deficits, no tremors   Data Review:   Micro Results Recent Results (from the past 240 hour(s))  SARS Coronavirus 2 by RT PCR (hospital order, performed in [facility] hospital lab) Nasopharyngeal Nasopharyngeal Swab     Status: None   Collection Time: 03/29/20  5:06 PM   Specimen: Nasopharyngeal Swab  Result Value Ref Range Status   SARS Coronavirus 2 NEGATIVE NEGATIVE  Final    Comment: (NOTE) SARS-CoV-2 target nucleic acids are NOT DETECTED. The SARS-CoV-2 RNA is generally detectable in upper and lower respiratory specimens during the acute phase of infection. The lowest concentration of SARS-CoV-2 viral copies this assay can detect is 250 copies / mL. A negative result does not preclude SARS-CoV-2 infection and should not be used as the sole basis for treatment or other patient management decisions.  A negative result may occur with improper specimen collection / handling, submission of specimen other than nasopharyngeal swab, presence of viral mutation(s) within the areas targeted by this assay, and inadequate number of viral copies (<250 copies / mL). A negative result must be combined with clinical observations, patient history, and epidemiological information. Fact Sheet for Patients:   BoilerBrush.com.cy Fact Sheet for Healthcare Providers: https://pope.com/ This test is not yet approved or cleared  by the [place] FDA and has been authorized for detection and/or diagnosis of SARS-CoV-2 by FDA under an Emergency Use Authorization (EUA).  This EUA will remain in effect (meaning this test can be used) for the duration of the COVID-19 declaration under Section 564(b)(1) of the Act, 21 U.S.C. section 360bbb-3(b)(1), unless the authorization is terminated or revoked sooner. Performed at [facility], [address], [place], [place] [address]   MRSA PCR Screening     Status: Abnormal   Collection Time: 03/29/20  8:48 PM   Specimen: Nasal Mucosa; Nasopharyngeal  Result Value Ref Range Status   MRSA by PCR POSITIVE (A) NEGATIVE Final    Comment:        The GeneXpert MRSA Assay (FDA approved for NASAL specimens only), is one component of a comprehensive MRSA colonization surveillance program. It is not intended to diagnose MRSA infection nor to guide or monitor treatment for MRSA  infections. RESULT CALLED TO, READ BACK BY AND VERIFIED WITH: AMBURN,A AT 0655 BY HUFFINES,S ON 03/30/20 Performed at [facility], [address], [place], [place] [address]     Radiology Reports CT Abdomen Pelvis W Contrast  Result Date: 03/29/2020 CLINICAL DATA:  Abdominal pain, unspecified. EXAM: CT ABDOMEN AND PELVIS WITH CONTRAST TECHNIQUE: Multidetector CT imaging of the abdomen and pelvis was performed using the standard protocol following bolus administration of intravenous contrast. CONTRAST:  24mL OMNIPAQUE IOHEXOL 300 MG/ML  SOLN COMPARISON:  Prior CT from 02/29/2020, history of pyelonephritis FINDINGS: Lower chest: Incidental imaging of the lung bases is unremarkable. No consolidation or evidence of pleural effusion. Hepatobiliary: Focal fat along the fissure for falciform ligament. Presumed hepatic steatosis. Portal vein is  patent. Gallbladder is normal. No biliary ductal dilation. Pancreas: Pancreas is normal without ductal dilation, inflammation or lesion. Spleen: Spleen is normal size without focal lesion. Adrenals/Urinary Tract: Adrenal glands are normal. Renal contours are smooth. No perinephric fluid. Urinary bladder is normal. No hydronephrosis. Stomach/Bowel: Stomach is normal. Small bowel is normal caliber. The appendix is normal. The ascending colon shows thickening and pericolonic stranding. No focal fluid. No pneumatosis. Process extends at least to the transverse colon. Descending colon and sigmoid appear spared at least radiographically. Vascular/Lymphatic: Patent abdominal vasculature. No adenopathy in the retroperitoneum or in the upper abdomen. SMV is patent. No signs of pelvic lymphadenopathy. Reproductive: No adnexal mass. Uterus unremarkable by CT. Other: No ascites. No free air. Musculoskeletal: No acute bone finding. No destructive bone process. Gas within the gluteal musculature along the RIGHT iliac crest and just below the iliac crest small amount of gas also in the  soft tissues of the LEFT buttock and hip. IMPRESSION: 1. Signs of colitis involving the ascending colon and potentially transverse colon, findings are nonspecific, potentially infectious or inflammatory. Enteritis was questioned on the previous exam. Correlate with any history of repeated abdominal pain or recent antibiotic administration. 2. Gas within the soft tissues of the RIGHT gluteal region is of uncertain significance, potentially introduced during medication administration or injection. Correlate with any injection in this area or signs of infection. Small amount of subcutaneous gas along the LEFT gluteal region remains in subcutaneous soft tissues more compatible with medication administration. 3. Normal appendix. 4. Hepatic steatosis. Electronically Signed   By: Donzetta Kohut M.D.   On: 03/29/2020 15:55   Portable chest x-ray (1 view)  Result Date: 03/29/2020 CLINICAL DATA:  DKA with weakness EXAM: PORTABLE CHEST 1 VIEW COMPARISON:  08/11/2019 FINDINGS: Cardiac shadows within normal limits. The lungs are well aerated bilaterally without focal infiltrate or sizable effusion. No bony abnormality is noted. IMPRESSION: No active disease. Electronically Signed   By: Alcide Clever M.D.   On: 03/29/2020 18:05     CBC Recent Labs  Lab 03/29/20 1213  WBC 12.2*  HGB 14.1  HCT 43.2  PLT 239  MCV 94.5  MCH 30.9  MCHC 32.6  RDW 12.8    Chemistries  Recent Labs  Lab 03/29/20 1213 03/29/20 1445 03/29/20 2103 03/30/20 0102 03/30/20 0505 03/30/20 0914 03/30/20 1502  NA 137   < > 138 137 136 137 137  K 4.8   < > 4.0 3.6 3.5 3.4* 3.4*  CL 101   < > 109 108 107 105 108  CO2 18*   < > 15* 18* 17* 16* 21*  GLUCOSE 318*   < > 141* 138* 292* 265* 169*  BUN 15   < > 13 11 11 10 6   CREATININE 0.75   < > 0.58 0.43* 0.53 0.52 0.46  CALCIUM 9.2   < > 8.2* 8.2* 8.2* 8.4* 8.2*  AST 36  --   --   --   --   --   --   ALT 22  --   --   --   --   --   --   ALKPHOS 87  --   --   --   --   --   --    BILITOT 1.2  --   --   --   --   --   --    < > = values in this interval not displayed.   ------------------------------------------------------------------------------------------------------------------ No results for input(s):  CHOL, HDL, LDLCALC, TRIG, CHOLHDL, LDLDIRECT in the last 72 hours.  Lab Results  Component Value Date   HGBA1C 8.9 (H) 02/29/2020   ------------------------------------------------------------------------------------------------------------------ No results for input(s): TSH, T4TOTAL, T3FREE, THYROIDAB in the last 72 hours.  Invalid input(s): FREET3 ------------------------------------------------------------------------------------------------------------------ No results for input(s): VITAMINB12, FOLATE, FERRITIN, TIBC, IRON, RETICCTPCT in the last 72 hours.  Coagulation profile No results for input(s): INR, PROTIME in the last 168 hours.  No results for input(s): DDIMER in the last 72 hours.  Cardiac Enzymes No results for input(s): CKMB, TROPONINI, MYOGLOBIN in the last 168 hours.  Invalid input(s): CK ------------------------------------------------------------------------------------------------------------------ No results found for: BNP   Shon Hale M.D on 03/30/2020 at 6:20 PM  Go to [email or web address] - for contact info  [facility] - Office  [phone]

## 2020-03-30 NOTE — Progress Notes (Addendum)
Inpatient Diabetes Program Recommendations  AACE/ADA: New Consensus Statement on Inpatient Glycemic Control (2015)  Target Ranges:  Prepandial:   less than 140 mg/dL      Peak postprandial:   less than 180 mg/dL (1-2 hours)      Critically ill patients:  140 - 180 mg/dL   Lab Results  Component Value Date   GLUCAP 165 (H) 03/30/2020   HGBA1C 8.9 (H) 02/29/2020   Results for Tammy, Higgins (MRN [id number]) as of 03/30/2020 10:32  Ref. Range 03/30/2020 09:14  Sodium Latest Ref Range: 135 - 145 mmol/L 137  Potassium Latest Ref Range: 3.5 - 5.1 mmol/L 3.4 (L)  Chloride Latest Ref Range: 98 - 111 mmol/L 105  CO2 Latest Ref Range: 22 - 32 mmol/L 16 (L)  Glucose Latest Ref Range: 70 - 99 mg/dL 893 (H)  BUN Latest Ref Range: 6 - 20 mg/dL 10  Creatinine Latest Ref Range: 0.44 - 1.00 mg/dL 8.10  Calcium Latest Ref Range: 8.9 - 10.3 mg/dL 8.4 (L)  Anion gap Latest Ref Range: 5 - 15  16 (H)  GFR, Est Non African American Latest Ref Range: >60 mL/min >60  GFR, Est African American Latest Ref Range: >60 mL/min >60  Results for [name], [name] (MRN [id number]) as of 03/30/2020 10:32  Ref. Range 03/29/2020 14:45 03/29/2020 17:17 03/30/2020 01:02 03/30/2020 09:14  Beta-Hydroxybutyric Acid Latest Ref Range: 0.05 - 0.27 mmol/L 7.14 (H) 6.00 (H) 2.16 (H) 4.48 (H)   Review of Glycemic Control Results for Tammy, Higgins (MRN [id number]) as of 03/30/2020 10:32  Ref. Range 03/30/2020 05:29 03/30/2020 06:32 03/30/2020 07:30 03/30/2020 08:30 03/30/2020 09:32  Glucose-Capillary Latest Ref Range: 70 - 99 mg/dL 361 (H) 443 (H) 154 (H) 249 (H) 244 (H)   Diabetes history: Type 1 DM since age 24 Outpatient Diabetes medications:  Lantus 10 units daily Novolog 1 unit for every 7 grams of CHO Novolog 1 unit for every 35 mg/dL>130 mg/dL  Current orders for Inpatient glycemic control:  IV insulin/DKA order set  Inpatient Diabetes Program Recommendations:         Spoke with patient by phone.  She states she is unsure of  what precipitated DKA? She states she has Von Willebrand which makes her menstrual cycles very difficult.  She started her period on 5/8 and states that she started having nausea/vomitting then which progressively got worse.       Patient last saw her endocrinologist on 02/13/20 and A1C=9.5% (which was an improvement).  She just had authorization done for insulins which allows her to receive her insulins for free from the patient assistance programs.  She states that she has no needs at this time related to her diabetes.       Patient is not ready for transition at this time as acidosis remains.  Consider increasing rate of Dextrose to assist in clearance of acidosis?  When patient is ready for transition off insulin drip (AG<12 and or Beta hydroxybutyrate<0.5),  Consider: - adding Lantus 10 units daily (2 hours prior to d/c of insulin drip).  - "very sensitive" Novolog correction 0-6 units tid with meals AND  - Novolog meal coverage 4 units tid with meals (hold if patient eats less than 50%).    Thanks,  Beryl Meager, RN, BC-ADM Inpatient Diabetes Coordinator Pager [phone] (8a-5p)

## 2020-03-30 NOTE — Progress Notes (Signed)
Pt c/o N&C- emesis bag given. No nausea medications ordered. Dr Welton Flakes paged and Zofran ordered and unsuccessful. Paged again for another nausea medication to be ordered. Waiting for orders/call back. Will continue to monitor pt

## 2020-03-31 LAB — BASIC METABOLIC PANEL
Anion gap: 7 (ref 5–15)
Anion gap: 6 (ref 5–15)
BUN: 6 mg/dL (ref 6–20)
BUN: 6 mg/dL (ref 6–20)
CO2: 21 mmol/L — ABNORMAL LOW (ref 22–32)
CO2: 22 mmol/L (ref 22–32)
Calcium: 8.1 mg/dL — ABNORMAL LOW (ref 8.9–10.3)
Calcium: 8.2 mg/dL — ABNORMAL LOW (ref 8.9–10.3)
Chloride: 108 mmol/L (ref 98–111)
Chloride: 109 mmol/L (ref 98–111)
Creatinine, Ser: 0.46 mg/dL (ref 0.44–1.00)
Creatinine, Ser: 0.45 mg/dL (ref 0.44–1.00)
GFR calc Af Amer: >60 mL/min (ref >60)
GFR calc Af Amer: >60 mL/min (ref >60)
GFR calc non Af Amer: >60 mL/min (ref >60)
GFR calc non Af Amer: >60 mL/min (ref >60)
Glucose, Bld: 155 mg/dL — ABNORMAL HIGH (ref 70–99)
Glucose, Bld: 162 mg/dL — ABNORMAL HIGH (ref 70–99)
Potassium: 3.9 mmol/L (ref 3.5–5.1)
Potassium: 3.9 mmol/L (ref 3.5–5.1)
Sodium: 136 mmol/L (ref 135–145)
Sodium: 137 mmol/L (ref 135–145)

## 2020-03-31 LAB — GLUCOSE, CAPILLARY
Glucose-Capillary: 127 mg/dL — ABNORMAL HIGH (ref 70–99)
Glucose-Capillary: 217 mg/dL — ABNORMAL HIGH (ref 70–99)
Glucose-Capillary: 189 mg/dL — ABNORMAL HIGH (ref 70–99)
Glucose-Capillary: 140 mg/dL — ABNORMAL HIGH (ref 70–99)

## 2020-03-31 LAB — BETA-HYDROXYBUTYRIC ACID
Beta-Hydroxybutyric Acid: 2.27 mmol/L — ABNORMAL HIGH (ref 0.05–0.27)
Beta-Hydroxybutyric Acid: 4.2 mmol/L — ABNORMAL HIGH (ref 0.05–0.27)

## 2020-03-31 LAB — HIV ANTIBODY (ROUTINE TESTING W REFLEX): HIV Screen 4th Generation wRfx: NONREACTIVE

## 2020-03-31 MED ORDER — CHLORHEXIDINE GLUCONATE CLOTH 2 % EX PADS: 6.0000 | MEDICATED_PAD | Freq: Every day | CUTANEOUS | Status: DC

## 2020-03-31 MED ORDER — PROMETHAZINE HCL 25 MG/ML IJ SOLN
25.0000 mg | Freq: Once | INTRAMUSCULAR | Status: AC
  Administered 2020-03-31: 25 mg via INTRAVENOUS
  Filled 2020-03-31: qty 1

## 2020-03-31 MED ORDER — INSULIN GLARGINE 100 UNIT/ML ~~LOC~~ SOLN: 12.0000 [IU] | Freq: Every day | SUBCUTANEOUS | 11 refills | Status: DC

## 2020-03-31 MED ORDER — ONDANSETRON HCL 4 MG PO TABS
4.0000 mg | ORAL_TABLET | Freq: Three times a day (TID) | ORAL | 1 refills | Status: DC | PRN
Start: 2020-03-31 — End: 2020-12-05

## 2020-03-31 MED ORDER — PROMETHAZINE HCL 25 MG PO TABS: 25.0000 mg | ORAL_TABLET | Freq: Three times a day (TID) | ORAL | 0 refills | Status: DC | PRN

## 2020-03-31 MED ORDER — SODIUM CHLORIDE 0.9 % IV BOLUS
500.0000 mL | Freq: Once | INTRAVENOUS | Status: AC
  Administered 2020-03-31: 500 mL via INTRAVENOUS

## 2020-03-31 MED ORDER — MUPIROCIN 2 % EX OINT: 1.0000 "application " | TOPICAL_OINTMENT | Freq: Two times a day (BID) | CUTANEOUS | Status: DC

## 2020-03-31 NOTE — Discharge Instructions (Signed)
1)Please keep your appointment with your Diabetic Specialist in [place] or Follow- up with New Endocrinologist  Dr. Purcell Nails, MD, Endocrinology, Diabetes & Metabolism-- [facility], [address] [place], [place] [address], Phone Number- tel:[phone] 2)Please establish care with [facility] (Primary Care)  3) please take your insulin as prescribed---Lantus insulin increased to 12 units from 10 units daily 4) please follow-up with Dr. Diona Browner at [facility]---[phone]--for further management of your von Willebrand's disease

## 2020-03-31 NOTE — Discharge Summary (Signed)
Tammy Higgins, is a 24 y.o. female  DOB 03/04/1996  MRN [id number].  Admission date:  03/29/2020  Admitting Physician  Roney Jaffe, MD  Discharge Date:  03/31/2020   Primary MD  System, Pcp Not In  Recommendations for primary care physician for things to follow:   1)Please keep your appointment with your Diabetic Specialist in [facility] or Follow- up with New Endocrinologist  Dr. Loni Beckwith, MD, Endocrinology, Diabetes & Metabolism-- [facility], [address] [place], [place] [address], Phone Number- [phone] 2)Please establish care with [facility] (Primary [facility])  3) please take your insulin as prescribed---Lantus insulin increased to 12 units from 10 units daily 4) please follow-up with Dr. Theressa Stamps at [facility]---[phone]--for further management of your von Willebrand's disease  Admission Diagnosis  DKA (diabetic ketoacidoses) ([place]) [E11.10] Type 1 diabetes mellitus with ketoacidosis without coma ([place]) [E10.10]   Discharge Diagnosis  DKA (diabetic ketoacidoses) ([place]) [E11.10] Type 1 diabetes mellitus with ketoacidosis without coma ([place]) [E10.10]    Active Problems:   Type 1 diabetes mellitus with ketoacidosis without coma ([place])   Von Willebrand disease ([place])   Nausea with vomiting   Diarrhea      Past Medical History:  Diagnosis Date  . Diabetes mellitus without complication ([place])   . Hypothyroidism   . Von Willebrand disease, type I [facility])     Past Surgical History:  Procedure Laterality Date  . INCISION AND DRAINAGE FOOT Left   . TONSILLECTOMY         HPI  from the history and physical done on the day of admission:    HPI: The patient is a 24 y.o. year-old w/ hx of IDDM, hypothyroid and von willebrand disease type 1 presented to ED w/ c/o abd pain in epigastric region and vomiting x 1--2 days.  Pt was seen by [facility]  yesterday for same symptoms. In ED labs showed suspected DKA w/ met acidosis, ^^glu 300- 450 and ^'d anion gap w/ +beta-hydroxybutyric acid. Pt was started on IV insulin protocol for DKA. Asked to see for admission.   Pt had DM1 at age 24. In her teen years states she had poor DM control and had 3 surgeries for infection on her feet then started to do better w/ her DM control for the last year or so.  Now she has am BS around 200- 300 and daytime BS's in 100- 200 range which is better. She takes lantus at night which was just lowered to 8units, and she takes SSI 4-5 times per day per the formula > BS -130 / 35.   Pt states that BS typically gets worse with her period.  She also frequently has N/V with her period.  Her period started 2 days ago and she started vomiting yesterday w/ L sided abd pain (mild).  She had 2 diarrheal stools yest and about 2 today. Today she cont'd to vomit and came to ED.  No bloody stools, no hx IBD or chronic bowel/ diarrhea issues.  Hospital Course:    1)DM1--patient was diagnosed with type I DM at age 10, -   A1c 8.9 (02/29/20--reflecting uncontrolled DM PTA -DKA pathophysiology resolved with IV insulin and IV fluids -Beta hydroxybutyric acid trended up again so additional IV fluid boluses and insulin were administered --Anion gap is down to 6 from a peak of 17 -Bicarb is up to 22 from a low of 12 -Serum glucose is down also -PTA patient was on Lantus insulin 10 units daily we will increase to 12 units  2) Nausea and Emesis--- no  hematemesis Pt tells me she quit smoking marijuana since her last visit,  -Patient states Vomiting was triggered by menstrual flow--she plans to start OCP to control her menstruations in the hope to control nausea and vomiting which of course cyclically every time she gets on her period -Lipase is not elevated -Suspect some degree of gastroparesis -Continue as needed antiemetics  3)Von Willebrand's disease--- stable, -  outpatient follow-up with hematologist advised  4)Hypothyroidism--- recentTSH 4.0,  Discharge Condition: stable  Follow UP--endocrinologist and hematologist as advised   Diet and Activity recommendation:  As advised  Discharge Instructions    Discharge Instructions    Call MD for:  difficulty breathing, headache or visual disturbances   Complete by: As directed    Call MD for:  persistant dizziness or light-headedness   Complete by: As directed    Call MD for:  persistant nausea and vomiting   Complete by: As directed    Call MD for:  severe uncontrolled pain   Complete by: As directed    Call MD for:  temperature >100.4   Complete by: As directed    Diet Carb Modified   Complete by: As directed    Discharge instructions   Complete by: As directed    1)Please keep your appointment with your Diabetic Specialist in [facility] or Follow- up with New Endocrinologist  Dr. Loni Beckwith, MD, Endocrinology, Diabetes & Metabolism-- [facility], [address] [place], [place] [address], Phone Number- [phone] 2)Please establish care with [facility] ([facility])  3) please take your insulin as prescribed---Lantus insulin increased to 12 units from 10 units daily 4) please follow-up with Dr. Theressa Stamps at [facility]---[phone]--for further management of your von Willebrand's disease   Increase activity slowly   Complete by: As directed        Discharge Medications     Allergies as of 03/31/2020   No Known Allergies     Medication List    TAKE these medications   acetaminophen 500 MG tablet Commonly known as: TYLENOL Take 1,000 mg by mouth every 6 (six) hours as needed.   insulin aspart 100 UNIT/ML injection Commonly known as: novoLOG Inject 1-7 Units into the skin 3 (three) times daily before meals. Uses according to a sliding scale at home.   insulin glargine 100 UNIT/ML injection Commonly known as: LANTUS Inject 0.12  mLs (12 Units total) into the skin at bedtime. What changed: how much to take   norethindrone-ethinyl estradiol 1-20 MG-MCG tablet Commonly known as: LOESTRIN Take 1 tablet by mouth daily.   omeprazole 20 MG capsule Commonly known as: PriLOSEC Take 1 capsule (20 mg total) by mouth daily.   ondansetron 4 MG tablet Commonly known as: ZOFRAN Take 1 tablet (4 mg total) by mouth every 8 (eight) hours as needed for nausea or vomiting.   promethazine 25 MG tablet Commonly known as: PHENERGAN Take 1 tablet (25 mg total) by mouth every  8 (eight) hours as needed for nausea or vomiting.       Major procedures and Radiology Reports - PLEASE review detailed and final reports for all details, in brief -     CT Abdomen Pelvis W Contrast  Result Date: 03/29/2020 CLINICAL DATA:  Abdominal pain, unspecified. EXAM: CT ABDOMEN AND PELVIS WITH CONTRAST TECHNIQUE: Multidetector CT imaging of the abdomen and pelvis was performed using the standard protocol following bolus administration of intravenous contrast. CONTRAST:  61m OMNIPAQUE IOHEXOL 300 MG/ML  SOLN COMPARISON:  Prior CT from 02/29/2020, history of pyelonephritis FINDINGS: Lower chest: Incidental imaging of the lung bases is unremarkable. No consolidation or evidence of pleural effusion. Hepatobiliary: Focal fat along the fissure for falciform ligament. Presumed hepatic steatosis. Portal vein is patent. Gallbladder is normal. No biliary ductal dilation. Pancreas: Pancreas is normal without ductal dilation, inflammation or lesion. Spleen: Spleen is normal size without focal lesion. Adrenals/Urinary Tract: Adrenal glands are normal. Renal contours are smooth. No perinephric fluid. Urinary bladder is normal. No hydronephrosis. Stomach/Bowel: Stomach is normal. Small bowel is normal caliber. The appendix is normal. The ascending colon shows thickening and pericolonic stranding. No focal fluid. No pneumatosis. Process extends at least to the transverse  colon. Descending colon and sigmoid appear spared at least radiographically. Vascular/Lymphatic: Patent abdominal vasculature. No adenopathy in the retroperitoneum or in the upper abdomen. SMV is patent. No signs of pelvic lymphadenopathy. Reproductive: No adnexal mass. Uterus unremarkable by CT. Other: No ascites. No free air. Musculoskeletal: No acute bone finding. No destructive bone process. Gas within the gluteal musculature along the RIGHT iliac crest and just below the iliac crest small amount of gas also in the soft tissues of the LEFT buttock and hip. IMPRESSION: 1. Signs of colitis involving the ascending colon and potentially transverse colon, findings are nonspecific, potentially infectious or inflammatory. Enteritis was questioned on the previous exam. Correlate with any history of repeated abdominal pain or recent antibiotic administration. 2. Gas within the soft tissues of the RIGHT gluteal region is of uncertain significance, potentially introduced during medication administration or injection. Correlate with any injection in this area or signs of infection. Small amount of subcutaneous gas along the LEFT gluteal region remains in subcutaneous soft tissues more compatible with medication administration. 3. Normal appendix. 4. Hepatic steatosis. Electronically Signed   By: GZetta BillsM.D.   On: 03/29/2020 15:55   Portable chest x-ray (1 view)  Result Date: 03/29/2020 CLINICAL DATA:  DKA with weakness EXAM: PORTABLE CHEST 1 VIEW COMPARISON:  08/11/2019 FINDINGS: Cardiac shadows within normal limits. The lungs are well aerated bilaterally without focal infiltrate or sizable effusion. No bony abnormality is noted. IMPRESSION: No active disease. Electronically Signed   By: MInez CatalinaM.D.   On: 03/29/2020 18:05    Micro Results    Recent Results (from the past 240 hour(s))  SARS Coronavirus 2 by RT PCR (hospital order[facility] Nasopharyngeal Nasopharyngeal  Swab     Status: None   Collection Time: 03/29/20  5:06 PM   Specimen: Nasopharyngeal Swab  Result Value Ref Range Status   SARS Coronavirus 2 NEGATIVE NEGATIVE Final    Comment: (NOTE) SARS-CoV-2 target nucleic acids are NOT DETECTED. The SARS-CoV-2 RNA is generally detectable in upper and lower respiratory specimens during the acute phase of infection. The lowest concentration of SARS-CoV-2 viral copies this assay can detect is 250 copies / mL. A negative result does not preclude SARS-CoV-2 infection and should not be used  as the sole basis for treatment or other patient management decisions.  A negative result may occur with improper specimen collection / handling, submission of specimen other than nasopharyngeal swab, presence of viral mutation(s) within the areas targeted by this assay, and inadequate number of viral copies (<250 copies / mL). A negative result must be combined with clinical observations, patient history, and epidemiological information. Fact Sheet for Patients:   StrictlyIdeas.no Fact Sheet for Healthcare Providers: BankingDealers.co.za This test is not yet approved or cleared  by the [place] FDA and has been authorized for detection and/or diagnosis of SARS-CoV-2 by FDA under an Emergency Use Authorization (EUA).  This EUA will remain in effect (meaning this test can be used) for the duration of the COVID-19 declaration under Section 564(b)(1) of the Act, 21 U.S.C. section 360bbb-3(b)(1), unless the authorization is terminated or revoked sooner. Performed at [facility], [address], [place], [place] [address]   MRSA PCR Screening     Status: Abnormal   Collection Time: 03/29/20  8:48 PM   Specimen: Nasal Mucosa; Nasopharyngeal  Result Value Ref Range Status   MRSA by PCR POSITIVE (A) NEGATIVE Final    Comment:        The GeneXpert MRSA Assay (FDA approved for NASAL specimens only), is one component  of a comprehensive MRSA colonization surveillance program. It is not intended to diagnose MRSA infection nor to guide or monitor treatment for MRSA infections. RESULT CALLED TO, READ BACK BY AND VERIFIED WITH: AMBURN,A AT 0655 BY HUFFINES,S ON 03/30/20 Performed at [facility], [address] [place], [place] [address]      Today   [place] today has no new complaints -No further emesis, Nausea improving, -Plan of care discussed with patient and her mother at bedside      Patient has been seen and examined prior to discharge   Objective   Blood pressure (!) 142/91, pulse (!) 102, temperature 98.6 F (37 C), temperature source Oral, resp. rate (!) 21, height '5\' 3"'  (1.6 m), weight 51.5 kg, last menstrual period 03/27/2020, SpO2 100 %.   Intake/Output Summary (Last 24 hours) at 03/31/2020 1314 Last data filed at 03/31/2020 0744 Gross per 24 hour  Intake 1857.45 ml  Output 500 ml  Net 1357.45 ml    Exam Gen:- Awake Alert, no acute distress  HEENT:- [place].AT, No sclera icterus Neck-Supple Neck,No JVD,.  Lungs-  CTAB , good air movement bilaterally  CV- S1, S2 normal, regular Abd-  +ve B.Sounds, Abd Soft, No tenderness,    Extremity/Skin:- No  edema,   good pulses Psych-affect is appropriate, oriented x3 Neuro-no new focal deficits, no tremors    Data Review   CBC w Diff:  Lab Results  Component Value Date   WBC 12.2 (H) 03/29/2020   HGB 14.1 03/29/2020   HCT 43.2 03/29/2020   PLT 239 03/29/2020   LYMPHOPCT 13 02/29/2020   MONOPCT 5 02/29/2020   EOSPCT 0 02/29/2020   BASOPCT 1 02/29/2020    CMP:  Lab Results  Component Value Date   NA 137 03/31/2020   K 3.9 03/31/2020   CL 109 03/31/2020   CO2 22 03/31/2020   BUN 6 03/31/2020   CREATININE 0.45 03/31/2020   PROT 7.7 03/29/2020   ALBUMIN 4.6 03/29/2020   BILITOT 1.2 03/29/2020   ALKPHOS 87 03/29/2020   AST 36 03/29/2020   ALT 22 03/29/2020  .   Total Discharge time is about  33 minutes  Roxan Hockey M.D on 03/31/2020 at 1:14 PM  Go to [email or web address] -  for contact info  [facility] - Office  [phone]

## 2020-03-31 NOTE — Progress Notes (Signed)
Inpatient Diabetes Program Recommendations  AACE/ADA: New Consensus Statement on Inpatient Glycemic Control (2015)  Target Ranges:  Prepandial:   less than 140 mg/dL      Peak postprandial:   less than 180 mg/dL (1-2 hours)      Critically ill patients:  140 - 180 mg/dL   Lab Results  Component Value Date   GLUCAP 217 (H) 03/31/2020   HGBA1C 8.9 (H) 02/29/2020    Review of Glycemic Control Results for TIANI, Tammy Higgins (MRN [id number]) as of 03/31/2020 09:26  Ref. Range 03/30/2020 18:46 03/30/2020 19:40 03/30/2020 22:01 03/31/2020 07:51  Glucose-Capillary Latest Ref Range: 70 - 99 mg/dL 412 (H) 878 (H) 676 (H) 217 (H)  Diabetes history: Type 1 DM since age 24 Outpatient Diabetes medications:  Lantus 10 units daily Novolog 1 unit for every 7 grams of CHO Novolog 1 unit for every 35 mg/dL>130 mg/dL  Inpatient Diabetes Program Recommendations:    Note Beta-Hydroxybutyrate increasing?  Blood sugar=217 mg/dL.   Please recheck BMP at noon to assess acidosis.   Consider increasing Lantus to 12 units daily.  Change Novolog correction to q 4 hours.    Thanks,  Beryl Meager, RN, BC-ADM Inpatient Diabetes Coordinator Pager [phone] (8a-5p)

## 2020-05-20 NOTE — H&P (Signed)
[facility] History & Physical  Kelly Splinter MD  Larene [place] Dake 03/29/2020  Chief Complaint: Abdominal pain, N/V HPI: The patient is a 24 y.o. year-old w/ hx of IDDM, hypothyroid and von willebrand disease type 1 presented to ED w/ c/o abd pain in epigastric region and vomiting x 1--2 days.  Pt was [facility] by [facility] yesterday for same symptoms. In ED labs showed suspected DKA w/ met acidosis, ^^glu 300- 450 and ^'d anion gap w/ +beta-hydroxybutyric acid. Pt was started on IV insulin protocol for DKA. Asked to see for admission.   Pt had DM1 at age 73. In her teen years states she had poor DM control and had 3 surgeries for infection on her feet then started to do better w/ her DM control for the last year or so.  Now she has am BS around 200- 300 and daytime BS's in 100- 200 range which is better. She takes lantus at night which was just lowered to 8units, and she takes SSI 4-5 times per day per the formula > BS -130 / 35.   Pt states that BS typically gets worse with her period.  She also frequently has N/V with her period.  Her period started 2 days ago and she started vomiting yesterday w/ L sided abd pain (mild).  She had 2 diarrheal stools yest and about 2 today. Today she cont'd to vomit and came to ED.  No bloody stools, no hx IBD or chronic bowel/ diarrhea issues.   ROS  denies CP  no joint pain   no HA  no blurry vision  no rash  no diarrhea  no nausea/ vomiting  no dysuria  no difficulty voiding  no change in urine color    Past Medical History  Past Medical History:  Diagnosis Date  . Diabetes mellitus without complication ([place])   . Hypothyroidism   . Von Willebrand disease, type I [facility])    Past Surgical History  Past Surgical History:  Procedure Laterality Date  . INCISION AND DRAINAGE FOOT Left   . TONSILLECTOMY     Family History No family history on file. Social History  reports that she has never smoked. She has never used smokeless tobacco.  She reports current alcohol use. She reports current drug use. Drug: Marijuana. Allergies No Known Allergies Home medications Prior to Admission medications   Medication Sig Start Date End Date Taking? Authorizing Provider  insulin aspart (NOVOLOG) 100 UNIT/ML injection Inject 1-7 Units into the skin 3 (three) times daily before meals. Uses according to a sliding scale at home.    Yes [provider]  norethindrone-ethinyl estradiol (LOESTRIN) 1-20 MG-MCG tablet Take 1 tablet by mouth daily. 03/23/20  Yes [provider]  omeprazole (PRILOSEC) 20 MG capsule Take 1 capsule (20 mg total) by mouth daily. 03/01/20 03/01/21 Yes Emokpae, Courage, MD  acetaminophen (TYLENOL) 500 MG tablet Take 1,000 mg by mouth every 6 (six) hours as needed.    [provider]  insulin glargine (LANTUS) 100 UNIT/ML injection Inject 0.12 mLs (12 Units total) into the skin at bedtime. 03/31/20   Roxan Hockey, MD  ondansetron (ZOFRAN) 4 MG tablet Take 1 tablet (4 mg total) by mouth every 8 (eight) hours as needed for nausea or vomiting. 03/31/20   Roxan Hockey, MD  promethazine (PHENERGAN) 25 MG tablet Take 1 tablet (25 mg total) by mouth every 8 (eight) hours as needed for nausea or vomiting. 03/31/20   Roxan Hockey, MD  Exam Gen alert small framed pleaseant WF no distress, calm No rash, cyanosis or gangrene Sclera anicteric, throat clear  No jvd or bruits Chest clear bilat to bases no rales, wheezing or bronchial BS RRR no MRG Abd soft ntnd no mass or ascites +bs, no rebound or guarding GU  defer MS no joint effusions or deformity Ext no LE or UE edema, no wounds or ulcers Neuro is alert, Ox 3 , nf    Home meds:  - lantus 10u hs/ novolog 1-7 u tid ac  - loestrin 1 qd  - prn's/ PPI     1213 pm  -- Na 137  K 4.8  CO2 18 AG 18  Glu 318      1445 pm  --- Na 135  K 5.1  CO2 12  AG > 20  Glu 447  Creat 0.78      IV reg insulin gtt started at 2:45p - 3:47 pm this  afternoon    SP bolus 2 L NS      1445 pm -- 7.26/ 30/ 50 venous gas      WBC 12k Hb14  plt 239  Beat-hydroxy = 7.14 ^^       UA > 500 glu, large Hb, 80 ketone, 30 prot , >50 rbc, no wbc        CT abd 5/10 > IMPRESSION: 1. Signs of colitis involving the ascending colon and potentially transverse colon, findings are nonspecific, potentially infectious or inflammatory. Enteritis was questioned on the previous exam. Correlate with any history of repeated abdominal pain or recent antibiotic administration. 2. Gas within the soft tissues of the RIGHT gluteal region is of uncertain significance, potentially introduced during medication administration or injection. Correlate with any injection in this area or signs of infection. Small amount of subcutaneous gas along the LEFT gluteal region remains in subcutaneous soft tissues more compatible with medication administration.  Assessment/ Plan: 1. DM1 with DKA - pt admitted and started on IV protocol for DKA. Pt feeling better.  Admitted to ICU, follow protocol.  Prob trigger may be N/V related to her menses and/or possible "colitis" noted on CT abd.  2. Abd pain - LUQ/LLQ per pt, minimal exam findings.  Possible "colitis" of ascend colon per CT today. There is mild ^WBC, but no fever. UA shows rbc's only (menses), CXR clear.  Follow symptoms, send GI pathogen panel.  3. H/o von willebrand's type 1 4. Hypothyroid    Kelly Splinter  MD 03/29/2020, 4:48 PM

## 2020-09-19 IMAGING — CR DG CHEST 1V PORT
1 series · 1 of 1 positions shown · non-contrast
Comparison: October 05, 2018

CLINICAL DATA: Cough and fever

EXAM:
PORTABLE CHEST 1 VIEW

[portable]
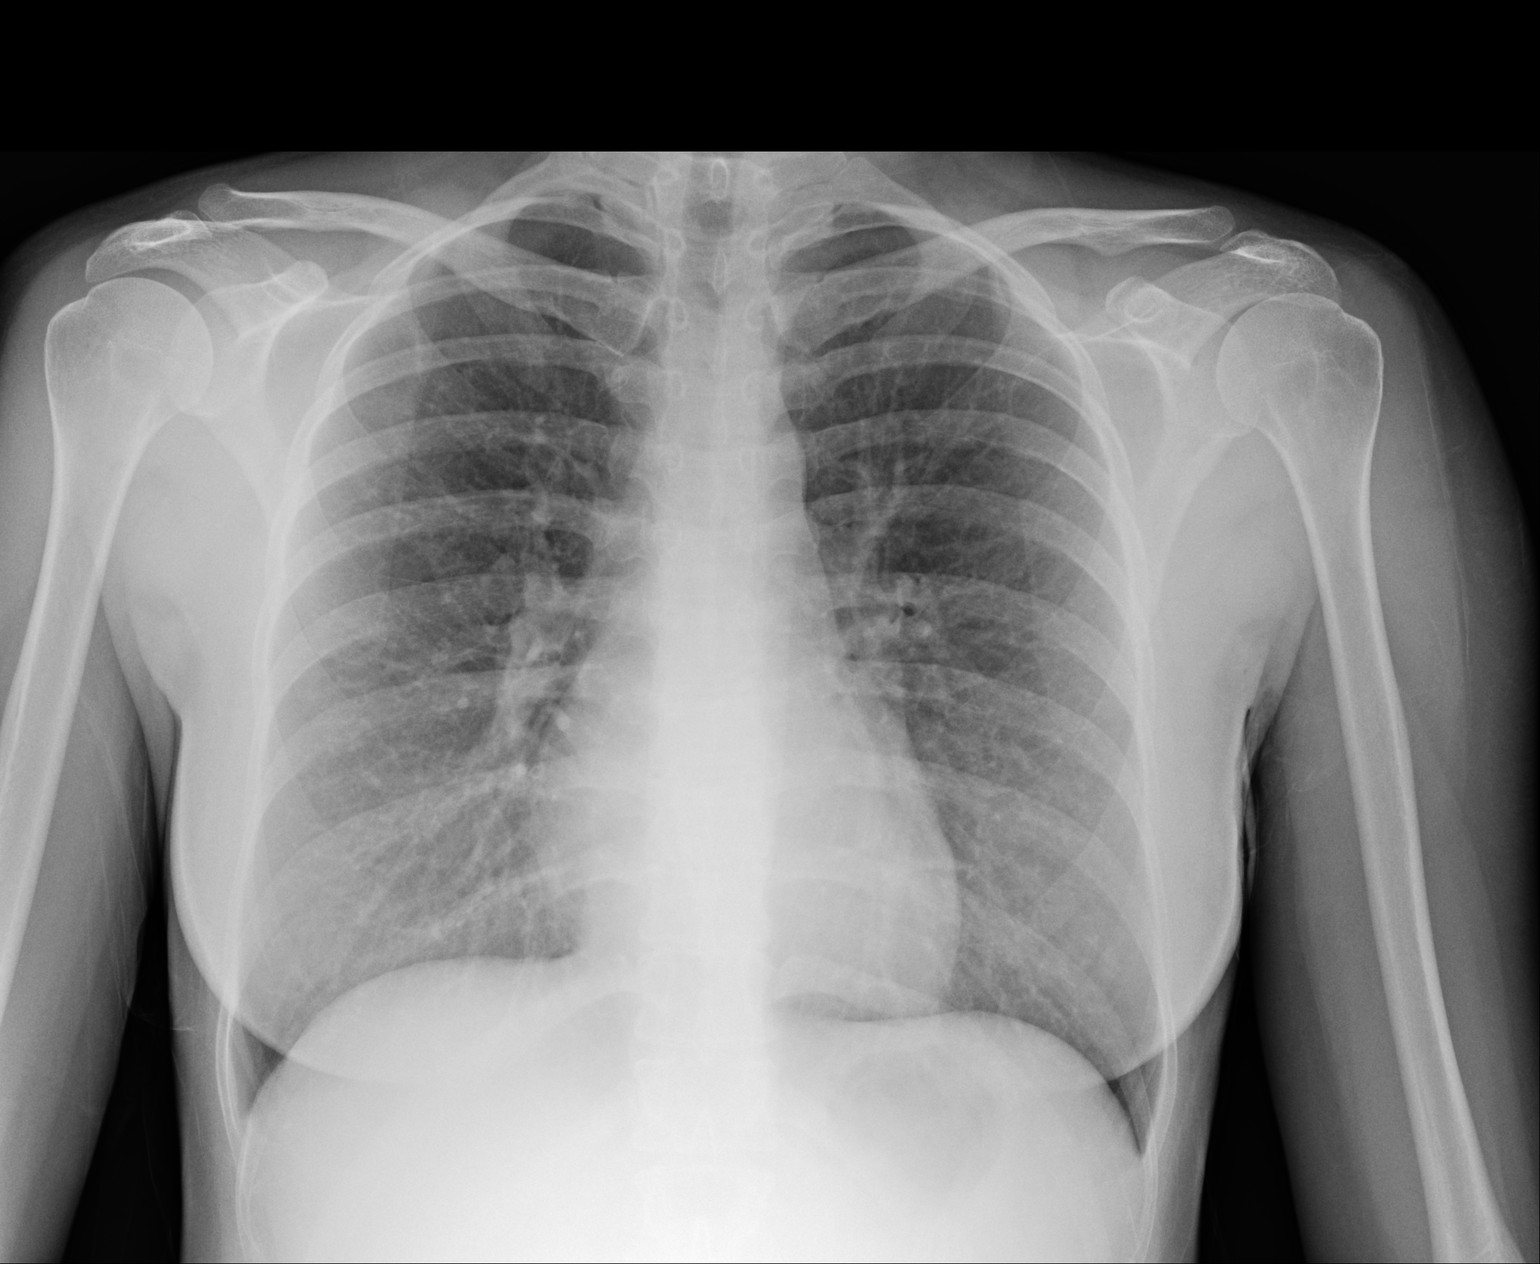

[1 of 1 positions shown; findings below may reference images not displayed]

FINDINGS: There is no edema or consolidation. Heart size and pulmonary
vascularity are normal. No adenopathy. No bone lesions.
IMPRESSION: No edema or consolidation.

## 2020-12-05 ENCOUNTER — Inpatient Hospital Stay (HOSPITAL_COMMUNITY)
Admission: EM | Admit: 2020-12-05 | Discharge: 2020-12-07 | DRG: 177 | Disposition: A | Discharge status: Home / Self Care | Payer: HRSA Program | Attending: Internal Medicine | Admitting: Internal Medicine

## 2020-12-05 ENCOUNTER — Emergency Department (HOSPITAL_COMMUNITY): Payer: HRSA Program

## 2020-12-05 ENCOUNTER — Other Ambulatory Visit: Payer: Self-pay

## 2020-12-05 ENCOUNTER — Encounter (HOSPITAL_COMMUNITY): Payer: Self-pay | Admitting: *Deleted

## 2020-12-05 DIAGNOSIS — N920 Excessive and frequent menstruation with regular cycle: Secondary | ICD-10-CM | POA: Diagnosis present

## 2020-12-05 DIAGNOSIS — R112 Nausea with vomiting, unspecified: Secondary | ICD-10-CM | POA: Diagnosis present

## 2020-12-05 DIAGNOSIS — E039 Hypothyroidism, unspecified: Secondary | ICD-10-CM | POA: Diagnosis present

## 2020-12-05 DIAGNOSIS — E869 Volume depletion, unspecified: Secondary | ICD-10-CM | POA: Diagnosis present

## 2020-12-05 DIAGNOSIS — E871 Hypo-osmolality and hyponatremia: Secondary | ICD-10-CM | POA: Diagnosis present

## 2020-12-05 DIAGNOSIS — Z87891 Personal history of nicotine dependence: Secondary | ICD-10-CM

## 2020-12-05 DIAGNOSIS — U071 COVID-19: Principal | ICD-10-CM

## 2020-12-05 DIAGNOSIS — Z794 Long term (current) use of insulin: Secondary | ICD-10-CM

## 2020-12-05 DIAGNOSIS — R079 Chest pain, unspecified: Secondary | ICD-10-CM | POA: Diagnosis not present

## 2020-12-05 DIAGNOSIS — D6800 Von Willebrand disease, unspecified: Secondary | ICD-10-CM | POA: Diagnosis present

## 2020-12-05 DIAGNOSIS — F1290 Cannabis use, unspecified, uncomplicated: Secondary | ICD-10-CM | POA: Diagnosis present

## 2020-12-05 DIAGNOSIS — E0781 Sick-euthyroid syndrome: Secondary | ICD-10-CM | POA: Diagnosis present

## 2020-12-05 DIAGNOSIS — E1010 Type 1 diabetes mellitus with ketoacidosis without coma: Secondary | ICD-10-CM

## 2020-12-05 DIAGNOSIS — D696 Thrombocytopenia, unspecified: Secondary | ICD-10-CM

## 2020-12-05 HISTORY — DX: Type 1 diabetes mellitus without complications: E10.9

## 2020-12-05 LAB — COMPREHENSIVE METABOLIC PANEL
ALT: 20 U/L (ref 0–44)
AST: 21 U/L (ref 15–41)
Albumin: 4.1 g/dL (ref 3.5–5.0)
Alkaline Phosphatase: 71 U/L (ref 38–126)
Anion gap: 16 — ABNORMAL HIGH (ref 5–15)
BUN: 13 mg/dL (ref 6–20)
CO2: 18 mmol/L — ABNORMAL LOW (ref 22–32)
Calcium: 8.8 mg/dL — ABNORMAL LOW (ref 8.9–10.3)
Chloride: 99 mmol/L (ref 98–111)
Creatinine, Ser: 0.65 mg/dL (ref 0.44–1.00)
GFR, Estimated: >60 mL/min (ref >60)
Glucose, Bld: 421 mg/dL — ABNORMAL HIGH (ref 70–99)
Potassium: 4.4 mmol/L (ref 3.5–5.1)
Sodium: 133 mmol/L — ABNORMAL LOW (ref 135–145)
Total Bilirubin: 1 mg/dL (ref 0.3–1.2)
Total Protein: 7.2 g/dL (ref 6.5–8.1)

## 2020-12-05 LAB — URINALYSIS, ROUTINE W REFLEX MICROSCOPIC
Bilirubin Urine: NEGATIVE
Glucose, UA: >=500 mg/dL — AB
Ketones, ur: 80 mg/dL — AB
Leukocytes,Ua: NEGATIVE
Nitrite: NEGATIVE
Protein, ur: NEGATIVE mg/dL
Specific Gravity, Urine: 1.024 (ref 1.005–1.030)
pH: 5 (ref 5.0–8.0)

## 2020-12-05 LAB — BLOOD GAS, VENOUS
Acid-base deficit: 6.6 mmol/L — ABNORMAL HIGH (ref 0.0–2.0)
Bicarbonate: 19.1 mmol/L — ABNORMAL LOW (ref 20.0–28.0)
FIO2: 21
O2 Saturation: 78.4 %
Patient temperature: 36.8
pCO2, Ven: 31.2 mmHg — ABNORMAL LOW (ref 44.0–60.0)
pH, Ven: 7.371 (ref 7.250–7.430)
pO2, Ven: 44.3 mmHg (ref 32.0–45.0)

## 2020-12-05 LAB — RAPID URINE DRUG SCREEN, HOSP PERFORMED
Amphetamines: NOT DETECTED
Barbiturates: NOT DETECTED
Benzodiazepines: NOT DETECTED
Cocaine: NOT DETECTED
Opiates: NOT DETECTED
Tetrahydrocannabinol: POSITIVE — AB

## 2020-12-05 LAB — GLUCOSE, CAPILLARY
Glucose-Capillary: 205 mg/dL — ABNORMAL HIGH (ref 70–99)
Glucose-Capillary: 169 mg/dL — ABNORMAL HIGH (ref 70–99)
Glucose-Capillary: 159 mg/dL — ABNORMAL HIGH (ref 70–99)
Glucose-Capillary: 262 mg/dL — ABNORMAL HIGH (ref 70–99)

## 2020-12-05 LAB — CBC WITH DIFFERENTIAL/PLATELET
Abs Immature Granulocytes: 0.01 10*3/uL (ref 0.00–0.07)
Basophils Absolute: 0 10*3/uL (ref 0.0–0.1)
Basophils Relative: 1 %
Eosinophils Absolute: 0 10*3/uL (ref 0.0–0.5)
Eosinophils Relative: 0 %
HCT: 41.8 % (ref 36.0–46.0)
Hemoglobin: 14.1 g/dL (ref 12.0–15.0)
Immature Granulocytes: 0 %
Lymphocytes Relative: 13 %
Lymphs Abs: 0.5 10*3/uL — ABNORMAL LOW (ref 0.7–4.0)
MCH: 30.5 pg (ref 26.0–34.0)
MCHC: 33.7 g/dL (ref 30.0–36.0)
MCV: 90.3 fL (ref 80.0–100.0)
Monocytes Absolute: 0.6 10*3/uL (ref 0.1–1.0)
Monocytes Relative: 15 %
Neutro Abs: 2.9 10*3/uL (ref 1.7–7.7)
Neutrophils Relative %: 71 %
Platelets: 150 10*3/uL (ref 150–400)
RBC: 4.63 MIL/uL (ref 3.87–5.11)
RDW: 11.9 % (ref 11.5–15.5)
WBC: 4 10*3/uL (ref 4.0–10.5)
nRBC: 0 % (ref 0.0–0.2)

## 2020-12-05 LAB — CBG MONITORING, ED
Glucose-Capillary: 421 mg/dL — ABNORMAL HIGH (ref 70–99)
Glucose-Capillary: 334 mg/dL — ABNORMAL HIGH (ref 70–99)
Glucose-Capillary: 440 mg/dL — ABNORMAL HIGH (ref 70–99)
Glucose-Capillary: 234 mg/dL — ABNORMAL HIGH (ref 70–99)

## 2020-12-05 LAB — HCG, QUANTITATIVE, PREGNANCY: hCG, Beta Chain, Quant, S: <1 m[IU]/mL (ref <5)

## 2020-12-05 LAB — RESP PANEL BY RT-PCR (FLU A&B, COVID) ARPGX2
Influenza A by PCR: NEGATIVE
Influenza B by PCR: NEGATIVE
SARS Coronavirus 2 by RT PCR: POSITIVE — AB

## 2020-12-05 LAB — BASIC METABOLIC PANEL
Anion gap: 11 (ref 5–15)
BUN: 12 mg/dL (ref 6–20)
CO2: 18 mmol/L — ABNORMAL LOW (ref 22–32)
Calcium: 8.1 mg/dL — ABNORMAL LOW (ref 8.9–10.3)
Chloride: 109 mmol/L (ref 98–111)
Creatinine, Ser: 0.49 mg/dL (ref 0.44–1.00)
GFR, Estimated: >60 mL/min (ref >60)
Glucose, Bld: 180 mg/dL — ABNORMAL HIGH (ref 70–99)
Potassium: 4 mmol/L (ref 3.5–5.1)
Sodium: 138 mmol/L (ref 135–145)

## 2020-12-05 LAB — BETA-HYDROXYBUTYRIC ACID
Beta-Hydroxybutyric Acid: 4.06 mmol/L — ABNORMAL HIGH (ref 0.05–0.27)
Beta-Hydroxybutyric Acid: 1.08 mmol/L — ABNORMAL HIGH (ref 0.05–0.27)

## 2020-12-05 LAB — POC URINE PREG, ED: Preg Test, Ur: NEGATIVE

## 2020-12-05 LAB — MRSA PCR SCREENING: MRSA by PCR: POSITIVE — AB

## 2020-12-05 LAB — TSH: TSH: 0.061 u[IU]/mL — ABNORMAL LOW (ref 0.350–4.500)

## 2020-12-05 MED ORDER — POTASSIUM CHLORIDE 10 MEQ/100ML IV SOLN
10.0000 meq | INTRAVENOUS | Status: DC
  Administered 2020-12-05: 10 meq via INTRAVENOUS
  Filled 2020-12-05 (×4): qty 100

## 2020-12-05 MED ORDER — ACETAMINOPHEN 325 MG PO TABS
650.0000 mg | ORAL_TABLET | Freq: Four times a day (QID) | ORAL | Status: DC | PRN
  Administered 2020-12-05 – 2020-12-06 (×2): 650 mg via ORAL
  Filled 2020-12-05 (×2): qty 2

## 2020-12-05 MED ORDER — CHLORHEXIDINE GLUCONATE CLOTH 2 % EX PADS: 6.0000 | MEDICATED_PAD | Freq: Every day | CUTANEOUS | Status: DC

## 2020-12-05 MED ORDER — ONDANSETRON HCL 4 MG/2ML IJ SOLN
4.0000 mg | Freq: Once | INTRAMUSCULAR | Status: AC
  Administered 2020-12-05: 4 mg via INTRAVENOUS
  Filled 2020-12-05: qty 2

## 2020-12-05 MED ORDER — ZINC SULFATE 220 (50 ZN) MG PO CAPS
220.0000 mg | ORAL_CAPSULE | Freq: Every day | ORAL | Status: DC
  Administered 2020-12-05 – 2020-12-07 (×3): 220 mg via ORAL
  Filled 2020-12-05 (×3): qty 1

## 2020-12-05 MED ORDER — DEXTROSE 50 % IV SOLN
0.0000 mL | INTRAVENOUS | Status: DC | PRN
Start: 2020-12-05 — End: 2020-12-07

## 2020-12-05 MED ORDER — CHLORHEXIDINE GLUCONATE CLOTH 2 % EX PADS
6.0000 | MEDICATED_PAD | Freq: Every day | CUTANEOUS | Status: DC
  Administered 2020-12-06 – 2020-12-07 (×2): 6 via TOPICAL

## 2020-12-05 MED ORDER — SODIUM CHLORIDE 0.9 % IV SOLN
100.0000 mg | INTRAVENOUS | Status: AC
  Administered 2020-12-05 (×2): 100 mg via INTRAVENOUS
  Filled 2020-12-05 (×2): qty 20

## 2020-12-05 MED ORDER — SODIUM CHLORIDE 0.9 % IV SOLN: 200.0000 mg | Freq: Once | INTRAVENOUS | Status: DC

## 2020-12-05 MED ORDER — INSULIN REGULAR(HUMAN) IN NACL 100-0.9 UT/100ML-% IV SOLN
INTRAVENOUS | Status: DC
  Administered 2020-12-05: 7.5 [IU]/h via INTRAVENOUS
  Filled 2020-12-05: qty 100

## 2020-12-05 MED ORDER — LACTATED RINGERS IV BOLUS
1000.0000 mL | Freq: Once | INTRAVENOUS | Status: AC
  Administered 2020-12-05: 1000 mL via INTRAVENOUS

## 2020-12-05 MED ORDER — HYDROCOD POLST-CPM POLST ER 10-8 MG/5ML PO SUER: 5.0000 mL | Freq: Two times a day (BID) | ORAL | Status: DC | PRN

## 2020-12-05 MED ORDER — MUPIROCIN 2 % EX OINT
1.0000 "application " | TOPICAL_OINTMENT | Freq: Two times a day (BID) | CUTANEOUS | Status: DC
  Administered 2020-12-05 – 2020-12-07 (×4): 1 via NASAL
  Filled 2020-12-05: qty 22

## 2020-12-05 MED ORDER — DEXTROSE 50 % IV SOLN: 0.0000 mL | INTRAVENOUS | Status: DC | PRN

## 2020-12-05 MED ORDER — SODIUM CHLORIDE 0.9 % IV SOLN: 100.0000 mg | Freq: Every day | INTRAVENOUS | Status: DC

## 2020-12-05 MED ORDER — ALBUTEROL SULFATE HFA 108 (90 BASE) MCG/ACT IN AERS: 2.0000 | INHALATION_SPRAY | Freq: Four times a day (QID) | RESPIRATORY_TRACT | Status: DC | PRN

## 2020-12-05 MED ORDER — ASCORBIC ACID 500 MG PO TABS
500.0000 mg | ORAL_TABLET | Freq: Every day | ORAL | Status: DC
  Administered 2020-12-05 – 2020-12-07 (×3): 500 mg via ORAL
  Filled 2020-12-05 (×3): qty 1

## 2020-12-05 MED ORDER — INSULIN REGULAR(HUMAN) IN NACL 100-0.9 UT/100ML-% IV SOLN
INTRAVENOUS | Status: DC
  Administered 2020-12-05: 3 [IU]/h via INTRAVENOUS

## 2020-12-05 MED ORDER — LACTATED RINGERS IV SOLN: INTRAVENOUS | Status: DC

## 2020-12-05 MED ORDER — DEXTROSE IN LACTATED RINGERS 5 % IV SOLN: INTRAVENOUS | Status: DC

## 2020-12-05 MED ORDER — POTASSIUM CHLORIDE 10 MEQ/100ML IV SOLN
10.0000 meq | INTRAVENOUS | Status: AC
  Administered 2020-12-05 (×2): 10 meq via INTRAVENOUS
  Filled 2020-12-05 (×2): qty 100

## 2020-12-05 MED ORDER — SODIUM CHLORIDE 0.9 % IV SOLN
100.0000 mg | Freq: Every day | INTRAVENOUS | Status: DC
  Administered 2020-12-06 – 2020-12-07 (×2): 100 mg via INTRAVENOUS
  Filled 2020-12-05 (×2): qty 20

## 2020-12-05 MED ORDER — NORETHINDRONE ACET-ETHINYL EST 1-20 MG-MCG PO TABS: 1.0000 | ORAL_TABLET | Freq: Every day | ORAL | Status: DC

## 2020-12-05 MED ORDER — ONDANSETRON HCL 4 MG PO TABS: 4.0000 mg | ORAL_TABLET | Freq: Four times a day (QID) | ORAL | Status: DC | PRN

## 2020-12-05 MED ORDER — INSULIN GLARGINE 100 UNIT/ML ~~LOC~~ SOLN
10.0000 [IU] | Freq: Every day | SUBCUTANEOUS | Status: DC
  Administered 2020-12-05: 10 [IU] via SUBCUTANEOUS
  Filled 2020-12-05 (×2): qty 0.1

## 2020-12-05 MED ORDER — ONDANSETRON HCL 4 MG/2ML IJ SOLN
4.0000 mg | Freq: Four times a day (QID) | INTRAMUSCULAR | Status: DC | PRN
  Administered 2020-12-06 (×2): 4 mg via INTRAVENOUS
  Filled 2020-12-05 (×3): qty 2

## 2020-12-05 MED ORDER — SODIUM CHLORIDE 0.9 % IV BOLUS
1000.0000 mL | Freq: Once | INTRAVENOUS | Status: AC
  Administered 2020-12-05: 1000 mL via INTRAVENOUS

## 2020-12-05 MED ORDER — PANTOPRAZOLE SODIUM 40 MG PO TBEC
40.0000 mg | DELAYED_RELEASE_TABLET | Freq: Every day | ORAL | Status: DC
  Administered 2020-12-05 – 2020-12-07 (×3): 40 mg via ORAL
  Filled 2020-12-05 (×4): qty 1

## 2020-12-05 MED ORDER — INSULIN ASPART 100 UNIT/ML ~~LOC~~ SOLN: 0.0000 [IU] | Freq: Three times a day (TID) | SUBCUTANEOUS | Status: DC

## 2020-12-05 MED ORDER — INSULIN ASPART 100 UNIT/ML ~~LOC~~ SOLN
0.0000 [IU] | Freq: Every day | SUBCUTANEOUS | Status: DC
  Administered 2020-12-05: 3 [IU] via SUBCUTANEOUS

## 2020-12-05 MED ORDER — GUAIFENESIN-DM 100-10 MG/5ML PO SYRP: 10.0000 mL | ORAL_SOLUTION | ORAL | Status: DC | PRN

## 2020-12-05 NOTE — H&P (Addendum)
History and Physical    Tammy Higgins GDJ:[id number] DOB: March 07, 1996 DOA: 12/05/2020  PCP: Pcp, No   Patient coming from: Home  Chief Complaint: N/V  HPI: Tammy Higgins is a 25 y.o. female with medical history significant for type 1 diabetes with multiple prior admissions for DKA, von Willebrand's disease, and prior tobacco abuse who presented to the ED with nausea and vomiting that began this morning.  She cannot tolerate any oral intake and states that her blood glucose readings have been uncontrolled.  She did began having headaches as well as rhinorrhea about 2 days ago and denies any particular sick contacts.  She denies any significant abdominal pain, chest pain, shortness of breath, fevers, or chills.  She is unvaccinated for COVID.   ED Course: Patient noted to have elevated blood glucose readings with bicarb of 18 and Gap of 16.  Ketones noted in the urine.  Pregnancy test is negative.  Chest x-ray with no acute findings.  She is currently on room air without any respiratory distress.  She has been started on a fluid bolus as well as IV fluid infusion and insulin drip.  COVID testing is positive.  Review of Systems: Reviewed as noted above, otherwise negative.  Past Medical History:  Diagnosis Date  . Hypothyroidism   . Type 1 diabetes (HCC)   . Von Willebrand disease, type I [facility])     Past Surgical History:  Procedure Laterality Date  . INCISION AND DRAINAGE FOOT Left   . TONSILLECTOMY       reports that she has never smoked. She has never used smokeless tobacco. She reports current alcohol use. She reports previous drug use. Drug: Marijuana.  No Known Allergies  No family history on file.  Prior to Admission medications   Medication Sig Start Date End Date Taking? Authorizing Provider  acetaminophen (TYLENOL) 500 MG tablet Take 1,000 mg by mouth every 6 (six) hours as needed.    [provider]  insulin aspart (NOVOLOG) 100 UNIT/ML injection Inject 1-7  Units into the skin 3 (three) times daily before meals. Uses according to a sliding scale at home.     [provider]  insulin glargine (LANTUS) 100 UNIT/ML injection Inject 0.12 mLs (12 Units total) into the skin at bedtime. 03/31/20   Shon Hale, MD  norethindrone-ethinyl estradiol (LOESTRIN) 1-20 MG-MCG tablet Take 1 tablet by mouth daily. 03/23/20   [provider]  omeprazole (PRILOSEC) 20 MG capsule Take 1 capsule (20 mg total) by mouth daily. 03/01/20 03/01/21  Shon Hale, MD  ondansetron (ZOFRAN) 4 MG tablet Take 1 tablet (4 mg total) by mouth every 8 (eight) hours as needed for nausea or vomiting. 03/31/20   Shon Hale, MD  promethazine (PHENERGAN) 25 MG tablet Take 1 tablet (25 mg total) by mouth every 8 (eight) hours as needed for nausea or vomiting. 03/31/20   Shon Hale, MD    Physical Exam: Vitals:   12/05/20 1030 12/05/20 1100 12/05/20 1130 12/05/20 1230  BP: 134/67 120/65 119/65 132/65  Pulse: (!) 115 (!) 113 (!) 117 (!) 118  Resp: (!) 23 (!) 24 (!) 26 10  Temp:      TempSrc:      SpO2: 100% 98% 99% 100%  Weight:      Height:        Constitutional: NAD, calm, comfortable Vitals:   12/05/20 1030 12/05/20 1100 12/05/20 1130 12/05/20 1230  BP: 134/67 120/65 119/65 132/65  Pulse: (!) 115 (!) 113 (!) 117 Marland Kitchen)  118  Resp: (!) 23 (!) 24 (!) 26 10  Temp:      TempSrc:      SpO2: 100% 98% 99% 100%  Weight:      Height:       Eyes: lids and conjunctivae normal Neck: normal, supple Respiratory: clear to auscultation bilaterally. Normal respiratory effort. No accessory muscle use.  Currently on room air. Cardiovascular: Regular rate and rhythm, no murmurs. Abdomen: no tenderness, no distention. Bowel sounds positive.  Musculoskeletal:  No edema. Skin: no rashes, lesions, ulcers.  Psychiatric: Flat affect  Labs on Admission: I have personally reviewed following labs and imaging studies  CBC: Recent Labs  Lab 12/05/20 0927  WBC 4.0   NEUTROABS 2.9  HGB 14.1  HCT 41.8  MCV 90.3  PLT 150   Basic Metabolic Panel: Recent Labs  Lab 12/05/20 0927  NA 133*  K 4.4  CL 99  CO2 18*  GLUCOSE 421*  BUN 13  CREATININE 0.65  CALCIUM 8.8*   GFR: Estimated Creatinine Clearance: 87.7 mL/min (by C-G formula based on SCr of 0.65 mg/dL). Liver Function Tests: Recent Labs  Lab 12/05/20 0927  AST 21  ALT 20  ALKPHOS 71  BILITOT 1.0  PROT 7.2  ALBUMIN 4.1   No results for input(s): LIPASE, AMYLASE in the last 168 hours. No results for input(s): AMMONIA in the last 168 hours. Coagulation Profile: No results for input(s): INR, PROTIME in the last 168 hours. Cardiac Enzymes: No results for input(s): CKTOTAL, CKMB, CKMBINDEX, TROPONINI in the last 168 hours. BNP (last 3 results) No results for input(s): PROBNP in the last 8760 hours. HbA1C: No results for input(s): HGBA1C in the last 72 hours. CBG: Recent Labs  Lab 12/05/20 0925 12/05/20 1131 12/05/20 1222  GLUCAP 421* 440* 334*   Lipid Profile: No results for input(s): CHOL, HDL, LDLCALC, TRIG, CHOLHDL, LDLDIRECT in the last 72 hours. Thyroid Function Tests: No results for input(s): TSH, T4TOTAL, FREET4, T3FREE, THYROIDAB in the last 72 hours. Anemia Panel: No results for input(s): VITAMINB12, FOLATE, FERRITIN, TIBC, IRON, RETICCTPCT in the last 72 hours. Urine analysis:    Component Value Date/Time   COLORURINE YELLOW 12/05/2020 1000   APPEARANCEUR CLEAR 12/05/2020 1000   LABSPEC 1.024 12/05/2020 1000   PHURINE 5.0 12/05/2020 1000   GLUCOSEU >=500 (A) 12/05/2020 1000   HGBUR LARGE (A) 12/05/2020 1000   BILIRUBINUR NEGATIVE 12/05/2020 1000   KETONESUR 80 (A) 12/05/2020 1000   PROTEINUR NEGATIVE 12/05/2020 1000   UROBILINOGEN 0.2 11/21/2013 0033   NITRITE NEGATIVE 12/05/2020 1000   LEUKOCYTESUR NEGATIVE 12/05/2020 1000    Radiological Exams on Admission: DG Chest Portable 1 View  Result Date: 12/05/2020 CLINICAL DATA:  Pt c/o vomiting that began  this morning, headache x 2 days, chills and runny nose. Pt has been exposed to someone with Covid 3-4 days ago but has not been tested. Pt denies diarrhea, abdominal pain, sore throat. Pt was Covid positive. HX of diabetes. EXAM: PORTABLE CHEST 1 VIEW COMPARISON:  03/29/2020 FINDINGS: Normal heart, mediastinum and hila. Prominent bronchovascular markings, stable from prior exam. Lungs clear. No pleural effusion or pneumothorax. Skeletal structures are grossly intact. IMPRESSION: No active disease. Electronically Signed   By: Amie Portland M.D.   On: 12/05/2020 12:33    Assessment/Plan Active Problems:   DKA, type 1 (HCC)    DKA in the setting of COVID infection -Give another liter bolus of fluid for total 2 L -Started on aggressive IV fluid hydration -Monitor repeat labs  as ordered -Continue IV insulin drip and monitor blood glucose readings -Keep npo for now and plan to start diet once anion gap is closed and acidosis has resolved  COVID infection -Unvaccinated -Started remdesivir and monitor inflammatory markers -Avoid steroids in the setting of DKA and without signs of infiltrate on chest x-ray or hypoxemia -Isolation precautions  Recent menorrhagia with von Willebrand's disease -Recent change in contraception -No anemia currently noted, continue to monitor -Avoid heparin agents and remain on SCDs  History of prior cannabis use and cannabis hyperemesis -Plan to check UDS  History of prior tobacco abuse -Quit 1 year prior  History of hypothyroidism -Not currently on medication -Check TSH  DVT prophylaxis: SCDs Code Status: Full Family Communication: None at bedside; pt will call Disposition Plan:Admit for DKA and COVID Consults called:None Admission status: Obs, SDU  Severity of Illness: The appropriate patient status for this patient is OBSERVATION. Observation status is judged to be reasonable and necessary in order to provide the required intensity of service to  ensure the patient's safety. The patient's presenting symptoms, physical exam findings, and initial radiographic and laboratory data in the context of their medical condition is felt to place them at decreased risk for further clinical deterioration. Furthermore, it is anticipated that the patient will be medically stable for discharge from the hospital within 2 midnights of admission. The following factors support the patient status of observation.   " The patient's presenting symptoms include n/v. " The initial radiographic and laboratory data are positive for DKA and COVID.     Tammy Gsell D Sherryll Burger DO [facility]  If 7PM-7AM, please contact night-coverage [email or web address]  12/05/2020, 1:22 PM

## 2020-12-05 NOTE — ED Triage Notes (Addendum)
Pt c/o vomiting that began this morning, headache x 2 days, chills and runny nose. Pt has been exposed to someone with Covid 3-4 days ago but has not been tested. Pt denies diarrhea, abdominal pain, sore throat.

## 2020-12-05 NOTE — ED Provider Notes (Signed)
[facility] EMERGENCY DEPARTMENT Provider Note   CSN: [id number] Arrival date & time: 12/05/20  [id number]     History Chief Complaint  Patient presents with  . Vomiting    Talbert CageShannon Stallman is a 25 y.o. female.  HPI Patient is a 25 year old female with history of hypothyroidism, von Willebrand's disease, cannabis hyperemesis syndrome, type 1 diabetes with multiple admissions for DKA.  She presents today with multiple complaints.  She says about 2 days ago she began experiencing a headache as well as rhinorrhea.  She is not vaccinated for COVID-19.  No sore throat, cough, chest pain, shortness of breath.  This morning she then began experiencing intractable nausea and vomiting.  She states she cannot tolerate any p.o. intake.  She states her blood sugar has been "all over the place" and was over 400 this morning as well as yesterday.  She took NovoLog just prior to arrival.  Nursing staff notified me that her blood sugars had decreased to about 200 but these were again rechecked and are now 421.  Patient denies any abdominal pain.    Past Medical History:  Diagnosis Date  . Hypothyroidism   . Type 1 diabetes (HCC)   . Von Willebrand disease, type I [facility](HCC)     Patient Active Problem List   Diagnosis Date Noted  . Nausea with vomiting 03/29/2020  . Diarrhea 03/29/2020  . Emesis, persistent 02/29/2020  . Type 1 diabetes mellitus with ketoacidosis without coma (HCC) 02/29/2020  . Von Willebrand disease (HCC) 02/29/2020  . Gastroenteritis 02/29/2020  . Cannabis abuse 02/29/2020  . Cannabis hyperemesis syndrome concurrent with and due to cannabis abuse (HCC) 02/29/2020  . UTI (urinary tract infection) 02/29/2020  . Pyelonephritis 02/29/2020    Past Surgical History:  Procedure Laterality Date  . INCISION AND DRAINAGE FOOT Left   . TONSILLECTOMY       OB History   No obstetric history on file.     No family history on file.  Social History   Tobacco Use  . Smoking status:  Never Smoker  . Smokeless tobacco: Never Used  Vaping Use  . Vaping Use: Never used  Substance Use Topics  . Alcohol use: Yes    Comment: occasionally  . Drug use: Not Currently    Types: Marijuana    Home Medications Prior to Admission medications   Medication Sig Start Date End Date Taking? Authorizing Provider  acetaminophen (TYLENOL) 500 MG tablet Take 1,000 mg by mouth every 6 (six) hours as needed.    [provider]  insulin aspart (NOVOLOG) 100 UNIT/ML injection Inject 1-7 Units into the skin 3 (three) times daily before meals. Uses according to a sliding scale at home.     [provider]  insulin glargine (LANTUS) 100 UNIT/ML injection Inject 0.12 mLs (12 Units total) into the skin at bedtime. 03/31/20   Shon HaleEmokpae, Courage, MD  norethindrone-ethinyl estradiol (LOESTRIN) 1-20 MG-MCG tablet Take 1 tablet by mouth daily. 03/23/20   [provider]  omeprazole (PRILOSEC) 20 MG capsule Take 1 capsule (20 mg total) by mouth daily. 03/01/20 03/01/21  Shon HaleEmokpae, Courage, MD  ondansetron (ZOFRAN) 4 MG tablet Take 1 tablet (4 mg total) by mouth every 8 (eight) hours as needed for nausea or vomiting. 03/31/20   Shon HaleEmokpae, Courage, MD  promethazine (PHENERGAN) 25 MG tablet Take 1 tablet (25 mg total) by mouth every 8 (eight) hours as needed for nausea or vomiting. 03/31/20   Shon HaleEmokpae, Courage, MD    Allergies  Patient has no known allergies.  Review of Systems   Review of Systems  All other systems reviewed and are negative. Ten systems reviewed and are negative for acute change, except as noted in the HPI.   Physical Exam Updated Vital Signs BP 120/65   Pulse (!) 113   Temp 99.2 F (37.3 C) (Oral)   Resp (!) 24   Ht 5\' 3"  (1.6 m)   Wt 51.7 kg   LMP 11/07/2020   SpO2 98%   BMI 20.19 kg/m   Physical Exam Vitals and nursing note reviewed.  Constitutional:      General: She is not in acute distress.    Appearance: Normal appearance. She is not ill-appearing,  toxic-appearing or diaphoretic.  HENT:     Head: Normocephalic and atraumatic.     Right Ear: External ear normal.     Left Ear: External ear normal.     Nose: Nose normal.     Mouth/Throat:     Mouth: Mucous membranes are moist.     Pharynx: Oropharynx is clear. No oropharyngeal exudate or posterior oropharyngeal erythema.  Eyes:     Extraocular Movements: Extraocular movements intact.  Cardiovascular:     Rate and Rhythm: Regular rhythm. Tachycardia present.     Pulses: Normal pulses.     Heart sounds: Normal heart sounds. No murmur heard. No friction rub. No gallop.   Pulmonary:     Effort: Pulmonary effort is normal. No respiratory distress.     Breath sounds: Normal breath sounds. No stridor. No wheezing, rhonchi or rales.  Abdominal:     General: Abdomen is flat.     Palpations: Abdomen is soft.     Tenderness: There is no abdominal tenderness.  Musculoskeletal:        General: Normal range of motion.     Cervical back: Normal range of motion and neck supple. No tenderness.  Skin:    General: Skin is warm and dry.  Neurological:     General: No focal deficit present.     Mental Status: She is alert and oriented to person, place, and time.  Psychiatric:        Mood and Affect: Mood normal.        Behavior: Behavior normal.    ED Results / Procedures / Treatments   Labs (all labs ordered are listed, but only abnormal results are displayed) Labs Reviewed  RESP PANEL BY RT-PCR (FLU A&B, COVID) ARPGX2 - Abnormal; Notable for the following components:      Result Value   SARS Coronavirus 2 by RT PCR POSITIVE (*)    All other components within normal limits  COMPREHENSIVE METABOLIC PANEL - Abnormal; Notable for the following components:   Sodium 133 (*)    CO2 18 (*)    Glucose, Bld 421 (*)    Calcium 8.8 (*)    Anion gap 16 (*)    All other components within normal limits  CBC WITH DIFFERENTIAL/PLATELET - Abnormal; Notable for the following components:   Lymphs Abs  0.5 (*)    All other components within normal limits  URINALYSIS, ROUTINE W REFLEX MICROSCOPIC - Abnormal; Notable for the following components:   Glucose, UA >=500 (*)    Hgb urine dipstick LARGE (*)    Ketones, ur 80 (*)    Bacteria, UA FEW (*)    All other components within normal limits  BLOOD GAS, VENOUS - Abnormal; Notable for the following components:   pCO2, Ven 31.2 (*)  Bicarbonate 19.1 (*)    Acid-base deficit 6.6 (*)    All other components within normal limits  BETA-HYDROXYBUTYRIC ACID - Abnormal; Notable for the following components:   Beta-Hydroxybutyric Acid 4.06 (*)    All other components within normal limits  CBG MONITORING, ED - Abnormal; Notable for the following components:   Glucose-Capillary 421 (*)    All other components within normal limits  CBG MONITORING, ED - Abnormal; Notable for the following components:   Glucose-Capillary 440 (*)    All other components within normal limits  HCG, QUANTITATIVE, PREGNANCY  POC URINE PREG, ED   EKG None  Radiology No results found.  Procedures .Critical Care Performed by: Placido Sou, PA-C Authorized by: Placido Sou, PA-C   Critical care provider statement:    Critical care time (minutes):  30   Critical care was necessary to treat or prevent imminent or life-threatening deterioration of the following conditions: DKA.   Critical care was time spent personally by me on the following activities:  Discussions with consultants, evaluation of patient's response to treatment, examination of patient, ordering and performing treatments and interventions, ordering and review of laboratory studies, ordering and review of radiographic studies, pulse oximetry, re-evaluation of patient's condition, obtaining history from patient or surrogate and review of old charts   (including critical care time)  Medications Ordered in ED Medications  insulin regular, human (MYXREDLIN) 100 units/ 100 mL infusion (7.5  Units/hr Intravenous New Bag/Given 12/05/20 1147)  lactated ringers infusion ( Intravenous New Bag/Given 12/05/20 1151)  dextrose 5 % in lactated ringers infusion (has no administration in time range)  dextrose 50 % solution 0-50 mL (has no administration in time range)  potassium chloride 10 mEq in 100 mL IVPB (has no administration in time range)  ondansetron (ZOFRAN) injection 4 mg (4 mg Intravenous Given 12/05/20 1012)  lactated ringers bolus 1,000 mL (0 mLs Intravenous Stopped 12/05/20 1148)    ED Course  I have reviewed the triage vital signs and the nursing notes.  Pertinent labs & imaging results that were available during my care of the patient were reviewed by me and considered in my medical decision making (see chart for details).  Clinical Course as of 12/05/20 1201  Sun Dec 05, 2020  1037 Ketones, ur(!): 80 [LJ]  1038 Glucose-Capillary(!): 421 [LJ]  1038 Glucose, UA(!): >=500 [LJ]  1100 Beta-Hydroxybutyric Acid(!): 4.06 [LJ]  1100 Anion gap(!): 16 [LJ]  1103 HCG, Beta Chain, Quant, S: <1 [LJ]    Clinical Course User Index [LJ] Placido Sou, PA-C   MDM Rules/Calculators/A&P                          Patient is a 25 year old female who presents the emergency department with 2 days of viral URI symptoms.  Not vaccinated for COVID-19 and found to be positive for COVID-19 today.  History of type 1 diabetes mellitus.  She has been admitted multiple times in the past for diabetic ketoacidosis.  She was hyperglycemic upon arrival.  She was started on IV fluids and found to be in DKA today.  Patient started on insulin drip.  Will admit to the medicine team for further management.  This was discussed with the patient and she is amenable.  Final Clinical Impression(s) / ED Diagnoses Final diagnoses:  Diabetic ketoacidosis without coma associated with type 1 diabetes mellitus (HCC)  COVID-19    Rx / DC Orders ED Discharge Orders    None  Placido Sou,  PA-C 12/05/20 1201    Bethann Berkshire, MD 12/05/20 1451

## 2020-12-05 NOTE — ED Notes (Signed)
CRITICAL VALUE ALERT  Critical Value:  covid positive  Date & Time Notied:  12/05/20 11:36  Provider Notified: Mariah Milling, PA  Orders Received/Actions taken: pa notified

## 2020-12-06 DIAGNOSIS — E039 Hypothyroidism, unspecified: Secondary | ICD-10-CM | POA: Diagnosis present

## 2020-12-06 DIAGNOSIS — D696 Thrombocytopenia, unspecified: Secondary | ICD-10-CM

## 2020-12-06 DIAGNOSIS — E871 Hypo-osmolality and hyponatremia: Secondary | ICD-10-CM | POA: Diagnosis present

## 2020-12-06 DIAGNOSIS — F1290 Cannabis use, unspecified, uncomplicated: Secondary | ICD-10-CM | POA: Diagnosis present

## 2020-12-06 DIAGNOSIS — D680 Von Willebrand's disease: Secondary | ICD-10-CM | POA: Diagnosis present

## 2020-12-06 DIAGNOSIS — R112 Nausea with vomiting, unspecified: Secondary | ICD-10-CM | POA: Diagnosis present

## 2020-12-06 DIAGNOSIS — Z794 Long term (current) use of insulin: Secondary | ICD-10-CM | POA: Diagnosis not present

## 2020-12-06 DIAGNOSIS — E0781 Sick-euthyroid syndrome: Secondary | ICD-10-CM | POA: Diagnosis present

## 2020-12-06 DIAGNOSIS — E869 Volume depletion, unspecified: Secondary | ICD-10-CM | POA: Diagnosis present

## 2020-12-06 DIAGNOSIS — U071 COVID-19: Secondary | ICD-10-CM | POA: Diagnosis present

## 2020-12-06 DIAGNOSIS — Z87891 Personal history of nicotine dependence: Secondary | ICD-10-CM | POA: Diagnosis not present

## 2020-12-06 DIAGNOSIS — N920 Excessive and frequent menstruation with regular cycle: Secondary | ICD-10-CM | POA: Diagnosis present

## 2020-12-06 DIAGNOSIS — E1010 Type 1 diabetes mellitus with ketoacidosis without coma: Secondary | ICD-10-CM | POA: Diagnosis present

## 2020-12-06 DIAGNOSIS — R079 Chest pain, unspecified: Secondary | ICD-10-CM | POA: Diagnosis not present

## 2020-12-06 LAB — COMPREHENSIVE METABOLIC PANEL
ALT: 22 U/L (ref 0–44)
AST: 30 U/L (ref 15–41)
Albumin: 3.5 g/dL (ref 3.5–5.0)
Alkaline Phosphatase: 62 U/L (ref 38–126)
Anion gap: 13 (ref 5–15)
BUN: 11 mg/dL (ref 6–20)
CO2: 16 mmol/L — ABNORMAL LOW (ref 22–32)
Calcium: 8.3 mg/dL — ABNORMAL LOW (ref 8.9–10.3)
Chloride: 105 mmol/L (ref 98–111)
Creatinine, Ser: 0.58 mg/dL (ref 0.44–1.00)
GFR, Estimated: >60 mL/min (ref >60)
Glucose, Bld: 278 mg/dL — ABNORMAL HIGH (ref 70–99)
Potassium: 3.8 mmol/L (ref 3.5–5.1)
Sodium: 134 mmol/L — ABNORMAL LOW (ref 135–145)
Total Bilirubin: 0.7 mg/dL (ref 0.3–1.2)
Total Protein: 6.2 g/dL — ABNORMAL LOW (ref 6.5–8.1)

## 2020-12-06 LAB — HEMOGLOBIN A1C
Hgb A1c MFr Bld: 8.4 % — ABNORMAL HIGH (ref 4.8–5.6)
Mean Plasma Glucose: 194.38 mg/dL

## 2020-12-06 LAB — CBC WITH DIFFERENTIAL/PLATELET
Abs Immature Granulocytes: 0.01 10*3/uL (ref 0.00–0.07)
Basophils Absolute: 0 10*3/uL (ref 0.0–0.1)
Basophils Relative: 0 %
Eosinophils Absolute: 0 10*3/uL (ref 0.0–0.5)
Eosinophils Relative: 0 %
HCT: 39.4 % (ref 36.0–46.0)
Hemoglobin: 13.4 g/dL (ref 12.0–15.0)
Immature Granulocytes: 0 %
Lymphocytes Relative: 20 %
Lymphs Abs: 0.9 10*3/uL (ref 0.7–4.0)
MCH: 30.7 pg (ref 26.0–34.0)
MCHC: 34 g/dL (ref 30.0–36.0)
MCV: 90.4 fL (ref 80.0–100.0)
Monocytes Absolute: 0.6 10*3/uL (ref 0.1–1.0)
Monocytes Relative: 13 %
Neutro Abs: 2.8 10*3/uL (ref 1.7–7.7)
Neutrophils Relative %: 67 %
Platelets: 115 10*3/uL — ABNORMAL LOW (ref 150–400)
RBC: 4.36 MIL/uL (ref 3.87–5.11)
RDW: 12.1 % (ref 11.5–15.5)
WBC: 4.3 10*3/uL (ref 4.0–10.5)
nRBC: 0 % (ref 0.0–0.2)

## 2020-12-06 LAB — BASIC METABOLIC PANEL
Anion gap: 18 — ABNORMAL HIGH (ref 5–15)
Anion gap: 13 (ref 5–15)
Anion gap: 7 (ref 5–15)
Anion gap: 9 (ref 5–15)
BUN: 14 mg/dL (ref 6–20)
BUN: 15 mg/dL (ref 6–20)
BUN: 10 mg/dL (ref 6–20)
BUN: 10 mg/dL (ref 6–20)
CO2: 13 mmol/L — ABNORMAL LOW (ref 22–32)
CO2: 14 mmol/L — ABNORMAL LOW (ref 22–32)
CO2: 21 mmol/L — ABNORMAL LOW (ref 22–32)
CO2: 20 mmol/L — ABNORMAL LOW (ref 22–32)
Calcium: 8.7 mg/dL — ABNORMAL LOW (ref 8.9–10.3)
Calcium: 8.1 mg/dL — ABNORMAL LOW (ref 8.9–10.3)
Calcium: 8.3 mg/dL — ABNORMAL LOW (ref 8.9–10.3)
Calcium: 8 mg/dL — ABNORMAL LOW (ref 8.9–10.3)
Chloride: 105 mmol/L (ref 98–111)
Chloride: 109 mmol/L (ref 98–111)
Chloride: 109 mmol/L (ref 98–111)
Chloride: 106 mmol/L (ref 98–111)
Creatinine, Ser: 0.71 mg/dL (ref 0.44–1.00)
Creatinine, Ser: 0.74 mg/dL (ref 0.44–1.00)
Creatinine, Ser: 0.52 mg/dL (ref 0.44–1.00)
Creatinine, Ser: 0.45 mg/dL (ref 0.44–1.00)
GFR, Estimated: >60 mL/min (ref >60)
GFR, Estimated: >60 mL/min (ref >60)
GFR, Estimated: >60 mL/min (ref >60)
GFR, Estimated: >60 mL/min (ref >60)
Glucose, Bld: 398 mg/dL — ABNORMAL HIGH (ref 70–99)
Glucose, Bld: 228 mg/dL — ABNORMAL HIGH (ref 70–99)
Glucose, Bld: 140 mg/dL — ABNORMAL HIGH (ref 70–99)
Glucose, Bld: 182 mg/dL — ABNORMAL HIGH (ref 70–99)
Potassium: 4.5 mmol/L (ref 3.5–5.1)
Potassium: 3.7 mmol/L (ref 3.5–5.1)
Potassium: 3.8 mmol/L (ref 3.5–5.1)
Potassium: 3.6 mmol/L (ref 3.5–5.1)
Sodium: 136 mmol/L (ref 135–145)
Sodium: 136 mmol/L (ref 135–145)
Sodium: 137 mmol/L (ref 135–145)
Sodium: 135 mmol/L (ref 135–145)

## 2020-12-06 LAB — BETA-HYDROXYBUTYRIC ACID
Beta-Hydroxybutyric Acid: 6.2 mmol/L — ABNORMAL HIGH (ref 0.05–0.27)
Beta-Hydroxybutyric Acid: 1.23 mmol/L — ABNORMAL HIGH (ref 0.05–0.27)

## 2020-12-06 LAB — PROTIME-INR
INR: 1.1 (ref 0.8–1.2)
Prothrombin Time: 13.9 seconds (ref 11.4–15.2)

## 2020-12-06 LAB — D-DIMER, QUANTITATIVE: D-Dimer, Quant: 0.56 ug/mL-FEU — ABNORMAL HIGH (ref 0.00–0.50)

## 2020-12-06 LAB — T4, FREE: Free T4: 1.11 ng/dL (ref 0.61–1.12)

## 2020-12-06 LAB — GLUCOSE, CAPILLARY
Glucose-Capillary: 226 mg/dL — ABNORMAL HIGH (ref 70–99)
Glucose-Capillary: 362 mg/dL — ABNORMAL HIGH (ref 70–99)
Glucose-Capillary: 376 mg/dL — ABNORMAL HIGH (ref 70–99)
Glucose-Capillary: 279 mg/dL — ABNORMAL HIGH (ref 70–99)
Glucose-Capillary: 179 mg/dL — ABNORMAL HIGH (ref 70–99)
Glucose-Capillary: 174 mg/dL — ABNORMAL HIGH (ref 70–99)
Glucose-Capillary: 132 mg/dL — ABNORMAL HIGH (ref 70–99)
Glucose-Capillary: 130 mg/dL — ABNORMAL HIGH (ref 70–99)
Glucose-Capillary: 169 mg/dL — ABNORMAL HIGH (ref 70–99)
Glucose-Capillary: 128 mg/dL — ABNORMAL HIGH (ref 70–99)

## 2020-12-06 LAB — FERRITIN: Ferritin: 120 ng/mL (ref 11–307)

## 2020-12-06 LAB — C-REACTIVE PROTEIN: CRP: 0.7 mg/dL (ref <1.0)

## 2020-12-06 LAB — FIBRINOGEN: Fibrinogen: 335 mg/dL (ref 210–475)

## 2020-12-06 LAB — FOLATE: Folate: 23.5 ng/mL (ref >5.9)

## 2020-12-06 LAB — APTT: aPTT: 28 seconds (ref 24–36)

## 2020-12-06 LAB — VITAMIN B12: Vitamin B-12: 389 pg/mL (ref 180–914)

## 2020-12-06 LAB — MAGNESIUM: Magnesium: 1.5 mg/dL — ABNORMAL LOW (ref 1.7–2.4)

## 2020-12-06 MED ORDER — PROCHLORPERAZINE EDISYLATE 10 MG/2ML IJ SOLN
10.0000 mg | Freq: Once | INTRAMUSCULAR | Status: AC
  Administered 2020-12-06: 10 mg via INTRAVENOUS
  Filled 2020-12-06: qty 2

## 2020-12-06 MED ORDER — POTASSIUM CHLORIDE 10 MEQ/100ML IV SOLN
10.0000 meq | INTRAVENOUS | Status: AC
  Administered 2020-12-06 (×2): 10 meq via INTRAVENOUS
  Filled 2020-12-06 (×2): qty 100

## 2020-12-06 MED ORDER — DEXTROSE IN LACTATED RINGERS 5 % IV SOLN: INTRAVENOUS | Status: DC

## 2020-12-06 MED ORDER — INSULIN GLARGINE 100 UNIT/ML ~~LOC~~ SOLN
10.0000 [IU] | Freq: Every day | SUBCUTANEOUS | Status: DC
  Administered 2020-12-06: 10 [IU] via SUBCUTANEOUS
  Filled 2020-12-06 (×3): qty 0.1

## 2020-12-06 MED ORDER — KETOROLAC TROMETHAMINE 15 MG/ML IJ SOLN
15.0000 mg | Freq: Once | INTRAMUSCULAR | Status: AC
  Administered 2020-12-06: 15 mg via INTRAVENOUS
  Filled 2020-12-06: qty 1

## 2020-12-06 MED ORDER — SODIUM CHLORIDE 0.9 % IV BOLUS
500.0000 mL | Freq: Once | INTRAVENOUS | Status: AC
  Administered 2020-12-06: 500 mL via INTRAVENOUS

## 2020-12-06 MED ORDER — INSULIN REGULAR(HUMAN) IN NACL 100-0.9 UT/100ML-% IV SOLN
INTRAVENOUS | Status: DC
  Administered 2020-12-06: 8 [IU]/h via INTRAVENOUS
  Filled 2020-12-06: qty 100

## 2020-12-06 MED ORDER — INSULIN ASPART 100 UNIT/ML ~~LOC~~ SOLN: 0.0000 [IU] | Freq: Every day | SUBCUTANEOUS | Status: DC

## 2020-12-06 MED ORDER — LACTATED RINGERS IV SOLN: INTRAVENOUS | Status: DC

## 2020-12-06 MED ORDER — MAGNESIUM SULFATE 2 GM/50ML IV SOLN
2.0000 g | Freq: Once | INTRAVENOUS | Status: AC
  Administered 2020-12-06: 2 g via INTRAVENOUS
  Filled 2020-12-06: qty 50

## 2020-12-06 MED ORDER — INSULIN ASPART 100 UNIT/ML ~~LOC~~ SOLN
0.0000 [IU] | Freq: Three times a day (TID) | SUBCUTANEOUS | Status: DC
  Administered 2020-12-07: 5 [IU] via SUBCUTANEOUS

## 2020-12-06 MED ORDER — LACTATED RINGERS IV BOLUS
20.0000 mL/kg | Freq: Once | INTRAVENOUS | Status: AC
  Administered 2020-12-06: 1050 mL via INTRAVENOUS

## 2020-12-06 MED ORDER — POTASSIUM CHLORIDE IN NACL 20-0.9 MEQ/L-% IV SOLN: INTRAVENOUS | Status: DC

## 2020-12-06 MED ORDER — PROCHLORPERAZINE MALEATE 5 MG PO TABS: 10.0000 mg | ORAL_TABLET | Freq: Once | ORAL | Status: DC

## 2020-12-06 MED ORDER — DEXTROSE 50 % IV SOLN: 0.0000 mL | INTRAVENOUS | Status: DC | PRN

## 2020-12-06 NOTE — Progress Notes (Addendum)
Inpatient Diabetes Program Recommendations  AACE/ADA: New Consensus Statement on Inpatient Glycemic Control   Target Ranges:  Prepandial:   less than 140 mg/dL      Peak postprandial:   less than 180 mg/dL (1-2 hours)      Critically ill patients:  140 - 180 mg/dL   Results for Tammy Higgins, Tammy Higgins (MRN [id number]) as of 12/06/2020 07:11  Ref. Range 12/05/2020 09:25 12/05/2020 11:31 12/05/2020 12:22 12/05/2020 13:20 12/05/2020 14:25 12/05/2020 15:37 12/05/2020 16:45 12/05/2020 21:15 12/06/2020 02:56  Glucose-Capillary Latest Ref Range: 70 - 99 mg/dL 952 (H) 841 (H) 324 (H) 234 (H) 205 (H) 169 (H) 159 (H) 262 (H) 226 (H)   Review of Glycemic Control  Diabetes history: DM1 (makes NO insulin; requires basal, correction, and carbohydrate coverage insulin) Outpatient Diabetes medications: Lantus 10 units QHS, Novolog 2-7 units TID with meals (1 unit for every 7 grams of carbs, 1 unit per 33 mg/dl above target of 401 mg/dl) Current orders for Inpatient glycemic control: IV insulin  Inpatient Diabetes Program Recommendations:    Insulin: Once acidosis is resolved and MD is ready to transition back to SQ insulin, please consider ordering Lantus to 15 units Q24, CBGs Q4H, Novolog 0-9 units Q4H, and ordering Novolog 3 units TID with meals for meal coverage if patient eats at least 50% of meals.  NOTE: Diabetes coordinator working remotely. Chart reviewed. Patient has Type 1 DM and is [facility] and last seen 02/13/20. Patient has multiple hospital admission for DKA. Per chart, patient last admitted with [facility] 07/05/20-07/08/20 with DKA.   Patient admitted with DKA and COVID positive. Initially ordered IV insulin which was transitioned to SQ insulin 12/05/20 when patient received Lantus 10 units at 17:04.  Glucose 226 mg/dl at 0:27, 253 mg/dl at 6:64, and 403 mg/dl at 47:42 today. Labs at 8:37 am today indicate patient back in DKA and IV insulin ordered to be restarted.    Addendum  12/06/20@11 :20-Spoke with patient over the phone regarding DM control. Patient states that she has been going to [facility] for healthcare needs and they are managing her DM. Patient states that she is taking Lantus 10 units QHS, Novolog 1 unit for 7 grams of carbs, and Novolog 1:33 mg/dl for correction. Patient states that she has Lantus and Novolog at home and notes that a friend recently gave her some Novolog and she has one more Lantus vial refill at [facility] that she can pick up next month. Patient states that she started about 2 weeks ago with getting signed up with [facility] and notes that she needs to send them a copy of her licence but she has turned in other paperwork. Patient reports that her DM has been running fairly well at home prior to past few days. Patient states that she is feeling like she could eat but notes that what she eats will depend on how the food tastes here. Patient states that she has no needs or concerns related to DM at this time. Discussed that once acidosis is resolved completely, it would be recommended that basal, correction, and carbohydrate coverage insulin be ordered.  Informed patient that diabetes coordinator will follow along while inpatient. Patient verbalized understanding of information and she has no questions or concerns at this time.   Thanks, Orlando Penner, RN, MSN, CDE Diabetes Coordinator Inpatient Diabetes Program [phone] (Team Pager from 8am to 5pm)

## 2020-12-06 NOTE — Progress Notes (Addendum)
PROGRESS NOTE  Tammy Higgins RJJ:[id number] DOB: 1996-04-15 DOA: 12/05/2020 PCP: Pcp, No  Brief History:  25 year old female with a history of type 1 diabetes mellitus, von Willebrand's disease, hypothyroidism, and history of tobacco abuse presenting with intractable nausea, vomiting, and abdominal pain for approximately 2 days.  Patient was not able to tolerate any p.o. intake.  She endorses compliance with all her insulin.  Unfortunately, approximately 2 days prior to this admission she began having rhinorrhea, nasal congestion, and headache.  She has had some subjective fevers and chills.  She denies any diarrhea, hematochezia, melena, dysuria, hematuria. Notably, the patient was diagnosed with diabetes at age 63.  She was last admitted to the hospital for DKA in May 2021.  Since then, the patient states that she has been compliant with her insulin. In the emergency department, the patient a low-grade temperature 99.2 F.  She was hemodynamically stable with oxygen saturation 97-98% on room air.  COVID 19 RT-PCR was positive.  The patient was noted to have a serum glucose of 421 with anion gap of 16.  She was started on IV insulin, IV fluids, and remdesivir. She was subsequently transitioned to subcutaneous insulin.  Unfortunately on the morning of 12/06/2020, her blood work suggested the she went back into mild DKA.  Once again, the patient was started back on IV insulin and IV fluids.  Assessment/Plan: DKA type I -patient started on IV insulin with q 1 hour CBG check and q 4 hour BMPs -pt started on aggressive fluid resuscitation -Electrolytes were monitored and repleted -transitioned to [place] insulin once anion gap closed -diet was advanced once anion gap closed -HbA1C-- -12/06/2020 AM--went back to DKA  COVID-19 infection -Continue remdesivir -CRP 0.7 -D-dimer 0.56 -Ferritin 120 -Stable on room air  Cannabis use -May be contributing to her nausea and vomiting -Continue  PPI -Cessation discussed  Thrombocytopenia - Due to viral infection -Serum B12 -Folic acid -Check coags  Hyponatremia -Secondary to volume depletion and hyperglycemia -Continue IV fluids  Hypothyroidism history -TSH 0.061 -Check free T4 -Not supplementation presently  Hypomagnesemia -replete    Status is: Observation  The patient will require care spanning > 2 midnights and should be moved to inpatient because: IV treatments appropriate due to intensity of illness or inability to take PO  Dispo: The patient is from: Home              Anticipated d/c is to: Home              Anticipated d/c date is: 2 days              Patient currently is not medically stable to d/c.        Family Communication:   No Family at bedside  Consultants:  none  Code Status:  FULL  DVT Prophylaxis:  xarelto   Procedures: As Listed in Progress Note Above  Antibiotics: None       Subjective: Patient continues to have nausea and vomiting.  She complains of abdominal pain and chest pain.  She denies any shortness of breath, diarrhea, hematochezia, melena.  There is no dysuria, hematuria.  Objective: Vitals:   12/05/20 1710 12/05/20 2000 12/06/20 0000 12/06/20 0400  BP: 130/66 127/61 131/64 124/66  Pulse: (!) 106 100 (!) 115 (!) 114  Resp:   20 20  Temp:  97.8 F (36.6 C) 98.1 F (36.7 C) 97.8 F (36.6 C)  TempSrc:  Oral Oral  Oral  SpO2: 98% 98% 97% 98%  Weight:      Height:        Intake/Output Summary (Last 24 hours) at 12/06/2020 0802 Last data filed at 12/05/2020 1710 Gross per 24 hour  Intake 2205.74 ml  Output --  Net 2205.74 ml   Weight change:  Exam:   General:  Pt is alert, follows commands appropriately, not in acute distress  HEENT: No icterus, No thrush, No neck mass, [place]/AT  Cardiovascular: RRR, S1/S2, no rubs, no gallops  Respiratory: Bibasilar rales but no wheezing.  Good air movement  Abdomen: Soft/+BS, upper abd tender, non distended,  no guarding  Extremities: No edema, No lymphangitis, No petechiae, No rashes, no synovitis   Data Reviewed: I have personally reviewed following labs and imaging studies Basic Metabolic Panel: Recent Labs  Lab 12/05/20 0927 12/05/20 1507 12/06/20 0340  NA 133* 138 134*  K 4.4 4.0 3.8  CL 99 109 105  CO2 18* 18* 16*  GLUCOSE 421* 180* 278*  BUN 13 12 11   CREATININE 0.65 0.49 0.58  CALCIUM 8.8* 8.1* 8.3*  MG  --   --  1.5*   Liver Function Tests: Recent Labs  Lab 12/05/20 0927 12/06/20 0340  AST 21 30  ALT 20 22  ALKPHOS 71 62  BILITOT 1.0 0.7  PROT 7.2 6.2*  ALBUMIN 4.1 3.5   No results for input(s): LIPASE, AMYLASE in the last 168 hours. No results for input(s): AMMONIA in the last 168 hours. Coagulation Profile: No results for input(s): INR, PROTIME in the last 168 hours. CBC: Recent Labs  Lab 12/05/20 0927 12/06/20 0340  WBC 4.0 4.3  NEUTROABS 2.9 2.8  HGB 14.1 13.4  HCT 41.8 39.4  MCV 90.3 90.4  PLT 150 115*   Cardiac Enzymes: No results for input(s): CKTOTAL, CKMB, CKMBINDEX, TROPONINI in the last 168 hours. BNP: Invalid input(s): POCBNP CBG: Recent Labs  Lab 12/05/20 1425 12/05/20 1537 12/05/20 1645 12/05/20 2115 12/06/20 0256  GLUCAP 205* 169* 159* 262* 226*   HbA1C: No results for input(s): HGBA1C in the last 72 hours. Urine analysis:    Component Value Date/Time   COLORURINE YELLOW 12/05/2020 1000   APPEARANCEUR CLEAR 12/05/2020 1000   LABSPEC 1.024 12/05/2020 1000   PHURINE 5.0 12/05/2020 1000   GLUCOSEU >=500 (A) 12/05/2020 1000   HGBUR LARGE (A) 12/05/2020 1000   BILIRUBINUR NEGATIVE 12/05/2020 1000   KETONESUR 80 (A) 12/05/2020 1000   PROTEINUR NEGATIVE 12/05/2020 1000   UROBILINOGEN 0.2 11/21/2013 0033   NITRITE NEGATIVE 12/05/2020 1000   LEUKOCYTESUR NEGATIVE 12/05/2020 1000   Sepsis Labs: @LABRCNTIP (procalcitonin:4,lacticidven:4) ) Recent Results (from the past 240 hour(s))  Resp Panel by RT-PCR (Flu A&B, Covid)  Nasopharyngeal Swab     Status: Abnormal   Collection Time: 12/05/20  9:29 AM   Specimen: Nasopharyngeal Swab; Nasopharyngeal(NP) swabs in vial transport medium  Result Value Ref Range Status   SARS Coronavirus 2 by RT PCR POSITIVE (A) NEGATIVE Final    Comment: RESULT CALLED TO, READ BACK BY AND VERIFIED WITH: BROOKE MYRICK,RN @1136  12/05/2020 KAY (NOTE) SARS-CoV-2 target nucleic acids are DETECTED.  The SARS-CoV-2 RNA is generally detectable in upper respiratory specimens during the acute phase of infection. Positive results are indicative of the presence of the identified virus, but do not rule out bacterial infection or co-infection with other pathogens not detected by the test. Clinical correlation with patient history and other diagnostic information is necessary to determine patient infection status. The expected result is  Negative.  Fact Sheet for Patients: BloggerCourse.com  Fact Sheet for Healthcare Providers: SeriousBroker.it  This test is not yet approved or cleared by the [place] FDA and  has been authorized for detection and/or diagnosis of SARS-CoV-2 by FDA under an Emergency Use Authorization (EUA).  This EUA will remain in effect (meaning this test ca n be used) for the duration of  the COVID-19 declaration under Section 564(b)(1) of the Act, 21 U.S.C. section 360bbb-3(b)(1), unless the authorization is terminated or revoked sooner.     Influenza A by PCR NEGATIVE NEGATIVE Final   Influenza B by PCR NEGATIVE NEGATIVE Final    Comment: (NOTE) The Xpert Xpress SARS-CoV-2/FLU/RSV plus assay is intended as an aid in the diagnosis of influenza from Nasopharyngeal swab specimens and should not be used as a sole basis for treatment. Nasal washings and aspirates are unacceptable for Xpert Xpress SARS-CoV-2/FLU/RSV testing.  Fact Sheet for Patients: BloggerCourse.com  Fact Sheet for  Healthcare Providers: SeriousBroker.it  This test is not yet approved or cleared by the [place] FDA and has been authorized for detection and/or diagnosis of SARS-CoV-2 by FDA under an Emergency Use Authorization (EUA). This EUA will remain in effect (meaning this test can be used) for the duration of the COVID-19 declaration under Section 564(b)(1) of the Act, 21 U.S.C. section 360bbb-3(b)(1), unless the authorization is terminated or revoked.  Performed at [facility], [address], [place], [place] [address]   MRSA PCR Screening     Status: Abnormal   Collection Time: 12/05/20  2:20 PM   Specimen: Nasal Mucosa; Nasopharyngeal  Result Value Ref Range Status   MRSA by PCR POSITIVE (A) NEGATIVE Final    Comment:        The GeneXpert MRSA Assay (FDA approved for NASAL specimens only), is one component of a comprehensive MRSA colonization surveillance program. It is not intended to diagnose MRSA infection nor to guide or monitor treatment for MRSA infections. RESULT CALLED TO, READ BACK BY AND VERIFIED WITH: ASHLEY SHELTON,RN @1702  12/05/2020 KAY Performed at [facility], [address], [place], [place]      Scheduled Meds: . vitamin C  500 mg Oral Daily  . Chlorhexidine Gluconate Cloth  6 each Topical Q0600  . mupirocin ointment  1 application Nasal BID  . pantoprazole  40 mg Oral Daily  . zinc sulfate  220 mg Oral Daily   Continuous Infusions: . dextrose 5% lactated ringers    . insulin    . lactated ringers    . lactated ringers    . potassium chloride    . remdesivir 100 mg in NS 100 mL      Procedures/Studies: DG Chest Portable 1 View  Result Date: 12/05/2020 CLINICAL DATA:  Pt c/o vomiting that began this morning, headache x 2 days, chills and runny nose. Pt has been exposed to someone with Covid 3-4 days ago but has not been tested. Pt denies diarrhea, abdominal pain, sore throat. Pt was Covid positive. HX of  diabetes. EXAM: PORTABLE CHEST 1 VIEW COMPARISON:  03/29/2020 FINDINGS: Normal heart, mediastinum and hila. Prominent bronchovascular markings, stable from prior exam. Lungs clear. No pleural effusion or pneumothorax. Skeletal structures are grossly intact. IMPRESSION: No active disease. Electronically Signed   By: 05/29/2020 M.D.   On: 12/05/2020 12:33    12/07/2020, DO  [facility]  If 7PM-7AM, please contact night-coverage [email or web address] Password [email or web address] 12/06/2020, 8:02 AM   LOS: 0 days

## 2020-12-06 NOTE — TOC Progression Note (Signed)
Transition of Care [facility]) - Progression Note    Patient Details  Name: Tammy Higgins MRN: [id number] Date of Birth: 09/20/1996  [facility]) CM/SW Contact  Karn [place], [place] Phone Number: 12/06/2020, 9:08 AM  Clinical Narrative: LCSW noted pt has no insurance and no PCP. LCSW spoke with pt who is agreeable to refer to Jerene Dilling, Artist. Referral made. Discussed referral to [facility]. However, pt states she has worked with [facility] and is followed by [facility]. No other needs reported by pt at this time.            Expected Discharge Plan and Services                                                 Social Determinants of Health (SDOH) Interventions    Readmission Risk Interventions No flowsheet data found.

## 2020-12-07 LAB — CBC WITH DIFFERENTIAL/PLATELET
Abs Immature Granulocytes: 0.01 10*3/uL (ref 0.00–0.07)
Basophils Absolute: 0 10*3/uL (ref 0.0–0.1)
Basophils Relative: 0 %
Eosinophils Absolute: 0 10*3/uL (ref 0.0–0.5)
Eosinophils Relative: 1 %
HCT: 36.6 % (ref 36.0–46.0)
Hemoglobin: 12.3 g/dL (ref 12.0–15.0)
Immature Granulocytes: 0 %
Lymphocytes Relative: 43 %
Lymphs Abs: 1.6 10*3/uL (ref 0.7–4.0)
MCH: 30.4 pg (ref 26.0–34.0)
MCHC: 33.6 g/dL (ref 30.0–36.0)
MCV: 90.4 fL (ref 80.0–100.0)
Monocytes Absolute: 0.4 10*3/uL (ref 0.1–1.0)
Monocytes Relative: 10 %
Neutro Abs: 1.7 10*3/uL (ref 1.7–7.7)
Neutrophils Relative %: 46 %
Platelets: 127 10*3/uL — ABNORMAL LOW (ref 150–400)
RBC: 4.05 MIL/uL (ref 3.87–5.11)
RDW: 12.3 % (ref 11.5–15.5)
WBC: 3.7 10*3/uL — ABNORMAL LOW (ref 4.0–10.5)
nRBC: 0 % (ref 0.0–0.2)

## 2020-12-07 LAB — COMPREHENSIVE METABOLIC PANEL
ALT: 22 U/L (ref 0–44)
AST: 26 U/L (ref 15–41)
Albumin: 2.9 g/dL — ABNORMAL LOW (ref 3.5–5.0)
Alkaline Phosphatase: 51 U/L (ref 38–126)
Anion gap: 7 (ref 5–15)
BUN: 12 mg/dL (ref 6–20)
CO2: 20 mmol/L — ABNORMAL LOW (ref 22–32)
Calcium: 8 mg/dL — ABNORMAL LOW (ref 8.9–10.3)
Chloride: 108 mmol/L (ref 98–111)
Creatinine, Ser: 0.51 mg/dL (ref 0.44–1.00)
GFR, Estimated: >60 mL/min (ref >60)
Glucose, Bld: 249 mg/dL — ABNORMAL HIGH (ref 70–99)
Potassium: 3.9 mmol/L (ref 3.5–5.1)
Sodium: 135 mmol/L (ref 135–145)
Total Bilirubin: 0.2 mg/dL — ABNORMAL LOW (ref 0.3–1.2)
Total Protein: 5 g/dL — ABNORMAL LOW (ref 6.5–8.1)

## 2020-12-07 LAB — FERRITIN: Ferritin: 171 ng/mL (ref 11–307)

## 2020-12-07 LAB — C-REACTIVE PROTEIN: CRP: 0.7 mg/dL (ref <1.0)

## 2020-12-07 LAB — BASIC METABOLIC PANEL
Anion gap: 6 (ref 5–15)
BUN: 11 mg/dL (ref 6–20)
CO2: 20 mmol/L — ABNORMAL LOW (ref 22–32)
Calcium: 8.1 mg/dL — ABNORMAL LOW (ref 8.9–10.3)
Chloride: 109 mmol/L (ref 98–111)
Creatinine, Ser: 0.49 mg/dL (ref 0.44–1.00)
GFR, Estimated: >60 mL/min (ref >60)
Glucose, Bld: 222 mg/dL — ABNORMAL HIGH (ref 70–99)
Potassium: 3.9 mmol/L (ref 3.5–5.1)
Sodium: 135 mmol/L (ref 135–145)

## 2020-12-07 LAB — MAGNESIUM: Magnesium: 1.5 mg/dL — ABNORMAL LOW (ref 1.7–2.4)

## 2020-12-07 LAB — GLUCOSE, CAPILLARY: Glucose-Capillary: 228 mg/dL — ABNORMAL HIGH (ref 70–99)

## 2020-12-07 LAB — D-DIMER, QUANTITATIVE: D-Dimer, Quant: 0.35 ug/mL-FEU (ref 0.00–0.50)

## 2020-12-07 LAB — BETA-HYDROXYBUTYRIC ACID: Beta-Hydroxybutyric Acid: 1.61 mmol/L — ABNORMAL HIGH (ref 0.05–0.27)

## 2020-12-07 MED ORDER — INSULIN GLARGINE 100 UNIT/ML ~~LOC~~ SOLN
15.0000 [IU] | Freq: Every day | SUBCUTANEOUS | Status: DC
  Administered 2020-12-07: 15 [IU] via SUBCUTANEOUS
  Filled 2020-12-07 (×2): qty 0.15

## 2020-12-07 MED ORDER — MAGNESIUM OXIDE 400 (241.3 MG) MG PO TABS
400.0000 mg | ORAL_TABLET | Freq: Every day | ORAL | Status: DC
  Administered 2020-12-07: 400 mg via ORAL
  Filled 2020-12-07: qty 1

## 2020-12-07 MED ORDER — INSULIN ASPART 100 UNIT/ML ~~LOC~~ SOLN: 3.0000 [IU] | Freq: Three times a day (TID) | SUBCUTANEOUS | Status: DC

## 2020-12-07 MED ORDER — MAGNESIUM OXIDE 400 (241.3 MG) MG PO TABS: 400.0000 mg | ORAL_TABLET | Freq: Every day | ORAL | 0 refills | Status: DC

## 2020-12-07 MED ORDER — INSULIN GLARGINE 100 UNIT/ML ~~LOC~~ SOLN: 15.0000 [IU] | Freq: Every day | SUBCUTANEOUS | 0 refills | Status: AC

## 2020-12-07 NOTE — Discharge Summary (Signed)
Physician Discharge Summary  Tammy Higgins RCV:[id number] DOB: [birth year] DOA: 12/05/2020  PCP: Pcp, No  Admit date: 12/05/2020 Discharge date: 12/07/2020  Admitted From: Home Disposition:  Home   Recommendations for Outpatient Follow-up:  1. Follow up with PCP in 1-2 weeks 2. Please obtain BMP/CBC in one week     Discharge Condition: Stable CODE STATUS: FULL Diet recommendation:  Carb Modified    Brief/Interim Summary: 25 year old female with a history of type 1 diabetes mellitus, von Willebrand's disease, hypothyroidism, and history of tobacco abuse presenting with intractable nausea, vomiting, and abdominal pain for approximately 2 days.  Patient was not able to tolerate any p.o. intake.  She endorses compliance with all her insulin.  Unfortunately, approximately 2 days prior to this admission she began having rhinorrhea, nasal congestion, and headache.  She has had some subjective fevers and chills.  She denies any diarrhea, hematochezia, melena, dysuria, hematuria. Notably, the patient was diagnosed with diabetes at age 33.  She was last admitted to the hospital for DKA in May 2021.  Since then, the patient states that she has been compliant with her insulin. In the emergency department, the patient a low-grade temperature 99.2 F.  She was hemodynamically stable with oxygen saturation 97-98% on room air.  COVID 19 RT-PCR was positive.  The patient was noted to have a serum glucose of 421 with anion gap of 16.  She was started on IV insulin, IV fluids, and remdesivir. She was subsequently transitioned to subcutaneous insulin.  Unfortunately on the morning of 12/06/2020, her blood work suggested the she went back into mild DKA.  Once again, the patient was started back on IV insulin and IV fluids and the patient ultimately improved and was transitioned back to [place] Lantus.  Her diet was advanced which she tolerated   Discharge Diagnoses:  DKA type I -patient started on IV insulin with  q 1 hour CBG check and q 4 hour BMPs -pt started on aggressive fluid resuscitation -Electrolytes were monitored and repleted -transitioned to [place] insulin once anion gap closed -diet was advanced once anion gap closed -HbA1C--8.4 -12/06/2020 AM--went back to DKA -12/08/19--improved; stable on [place] Lantus and ISS  COVID-19 infection -Continue remdesivir--received 3 days -CRP 0.7>>0.7 -D-dimer 0.56>>0.35 -Ferritin 120>>171 -Stable on room air  Cannabis use -May be contributing to her nausea and vomiting -Continue PPI -Cessation discussed  Thrombocytopenia - Due to viral infection -Serum B12--389 -Folic acid--23.5 -Check coags--normal -stable/improving  Hyponatremia -Secondary to volume depletion and hyperglycemia -Continue IV fluids  Hypothyroidism history -TSH 0.061 -Check free T4--1.11 -TFTs represent euthyroid sick syndrome -Not supplementation presently  Hypomagnesemia -replete -d/c home with mag ox 400 mg daily   Discharge Instructions   Allergies as of 12/07/2020   No Known Allergies     Medication List    STOP taking these medications   acetaminophen 500 MG tablet Commonly known as: TYLENOL   norethindrone-ethinyl estradiol 1-20 MG-MCG tablet Commonly known as: LOESTRIN   omeprazole 20 MG capsule Commonly known as: PriLOSEC     TAKE these medications   insulin aspart 100 UNIT/ML injection Commonly known as: novoLOG Inject 2-7 Units into the skin 3 (three) times daily before meals. Uses according to a sliding scale at home.   insulin glargine 100 UNIT/ML injection Commonly known as: LANTUS Inject 0.15 mLs (15 Units total) into the skin at bedtime. What changed: how much to take   magnesium oxide 400 (241.3 Mg) MG tablet Commonly known as: MAG-OX Take 1 tablet (400 mg total)  by mouth daily.       No Known Allergies  Consultations:  none   Procedures/Studies: DG Chest Portable 1 View  Result Date: 12/05/2020 CLINICAL DATA:  Pt  c/o vomiting that began this morning, headache x 2 days, chills and runny nose. Pt has been exposed to someone with Covid 3-4 days ago but has not been tested. Pt denies diarrhea, abdominal pain, sore throat. Pt was Covid positive. HX of diabetes. EXAM: PORTABLE CHEST 1 VIEW COMPARISON:  03/29/2020 FINDINGS: Normal heart, mediastinum and hila. Prominent bronchovascular markings, stable from prior exam. Lungs clear. No pleural effusion or pneumothorax. Skeletal structures are grossly intact. IMPRESSION: No active disease. Electronically Signed   By: Amie Portland M.D.   On: 12/05/2020 12:33        Discharge Exam: Vitals:   12/07/20 0420 12/07/20 0532  BP:  124/78  Pulse:    Resp:  13  Temp: 98.3 F (36.8 C)   SpO2:     Vitals:   12/06/20 2100 12/06/20 2353 12/07/20 0420 12/07/20 0532  BP:    124/78  Pulse:      Resp:    13  Temp: 98.9 F (37.2 C) 98.7 F (37.1 C) 98.3 F (36.8 C)   TempSrc: Oral Oral Oral   SpO2:      Weight:   54.2 kg   Height:        General: Pt is alert, awake, not in acute distress Cardiovascular: RRR, S1/S2 +, no rubs, no gallops Respiratory: CTA bilaterally, no wheezing, no rhonchi Abdominal: Soft, NT, ND, bowel sounds + Extremities: no edema, no cyanosis   The results of significant diagnostics from this hospitalization (including imaging, microbiology, ancillary and laboratory) are listed below for reference.    Significant Diagnostic Studies: DG Chest Portable 1 View  Result Date: 12/05/2020 CLINICAL DATA:  Pt c/o vomiting that began this morning, headache x 2 days, chills and runny nose. Pt has been exposed to someone with Covid 3-4 days ago but has not been tested. Pt denies diarrhea, abdominal pain, sore throat. Pt was Covid positive. HX of diabetes. EXAM: PORTABLE CHEST 1 VIEW COMPARISON:  03/29/2020 FINDINGS: Normal heart, mediastinum and hila. Prominent bronchovascular markings, stable from prior exam. Lungs clear. No pleural effusion or  pneumothorax. Skeletal structures are grossly intact. IMPRESSION: No active disease. Electronically Signed   By: Amie Portland M.D.   On: 12/05/2020 12:33     Microbiology: Recent Results (from the past 240 hour(s))  Resp Panel by RT-PCR (Flu A&B, Covid) Nasopharyngeal Swab     Status: Abnormal   Collection Time: 12/05/20  9:29 AM   Specimen: Nasopharyngeal Swab; Nasopharyngeal(NP) swabs in vial transport medium  Result Value Ref Range Status   SARS Coronavirus 2 by RT PCR POSITIVE (A) NEGATIVE Final    Comment: RESULT CALLED TO, READ BACK BY AND VERIFIED WITH: BROOKE MYRICK,RN @1136  12/05/2020 KAY (NOTE) SARS-CoV-2 target nucleic acids are DETECTED.  The SARS-CoV-2 RNA is generally detectable in upper respiratory specimens during the acute phase of infection. Positive results are indicative of the presence of the identified virus, but do not rule out bacterial infection or co-infection with other pathogens not detected by the test. Clinical correlation with patient history and other diagnostic information is necessary to determine patient infection status. The expected result is Negative.  Fact Sheet for Patients: 12/07/2020  Fact Sheet for Healthcare Providers: BloggerCourse.com  This test is not yet approved or cleared by the SeriousBroker.[place] FDA and  has been  authorized for detection and/or diagnosis of SARS-CoV-2 by FDA under an Emergency Use Authorization (EUA).  This EUA will remain in effect (meaning this test ca n be used) for the duration of  the COVID-19 declaration under Section 564(b)(1) of the Act, 21 U.S.C. section 360bbb-3(b)(1), unless the authorization is terminated or revoked sooner.     Influenza A by PCR NEGATIVE NEGATIVE Final   Influenza B by PCR NEGATIVE NEGATIVE Final    Comment: (NOTE) The Xpert Xpress SARS-CoV-2/FLU/RSV plus assay is intended as an aid in the diagnosis of influenza from  Nasopharyngeal swab specimens and should not be used as a sole basis for treatment. Nasal washings and aspirates are unacceptable for Xpert Xpress SARS-CoV-2/FLU/RSV testing.  Fact Sheet for Patients: BloggerCourse.com  Fact Sheet for Healthcare Providers: SeriousBroker.it  This test is not yet approved or cleared by the [place] FDA and has been authorized for detection and/or diagnosis of SARS-CoV-2 by FDA under an Emergency Use Authorization (EUA). This EUA will remain in effect (meaning this test can be used) for the duration of the COVID-19 declaration under Section 564(b)(1) of the Act, 21 U.S.C. section 360bbb-3(b)(1), unless the authorization is terminated or revoked.  Performed at [facility], [address] [place], [place] [address]   MRSA PCR Screening     Status: Abnormal   Collection Time: 12/05/20  2:20 PM   Specimen: Nasal Mucosa; Nasopharyngeal  Result Value Ref Range Status   MRSA by PCR POSITIVE (A) NEGATIVE Final    Comment:        The GeneXpert MRSA Assay (FDA approved for NASAL specimens only), is one component of a comprehensive MRSA colonization surveillance program. It is not intended to diagnose MRSA infection nor to guide or monitor treatment for MRSA infections. RESULT CALLED TO, READ BACK BY AND VERIFIED WITH: ASHLEY SHELTON,RN @1702  12/05/2020 KAY Performed at [facility], [address], [place], [place]      Labs: Basic Metabolic Panel: Recent Labs  Lab 12/06/20 0340 12/06/20 0837 12/06/20 1149 12/06/20 1720 12/06/20 2050 12/07/20 0114 12/07/20 0538  NA 134*   < > 136 137 135 135 135  K 3.8   < > 3.7 3.8 3.6 3.9 3.9  CL 105   < > 109 109 106 109 108  CO2 16*   < > 14* 21* 20* 20* 20*  GLUCOSE 278*   < > 228* 140* 182* 222* 249*  BUN 11   < > 15 10 10 11 12   CREATININE 0.58   < > 0.74 0.52 0.45 0.49 0.51  CALCIUM 8.3*   < > 8.1* 8.3* 8.0* 8.1* 8.0*  MG 1.5*  --    --   --   --   --  1.5*   < > = values in this interval not displayed.   Liver Function Tests: Recent Labs  Lab 12/05/20 0927 12/06/20 0340 12/07/20 0538  AST 21 30 26   ALT 20 22 22   ALKPHOS 71 62 51  BILITOT 1.0 0.7 0.2*  PROT 7.2 6.2* 5.0*  ALBUMIN 4.1 3.5 2.9*   No results for input(s): LIPASE, AMYLASE in the last 168 hours. No results for input(s): AMMONIA in the last 168 hours. CBC: Recent Labs  Lab 12/05/20 0927 12/06/20 0340 12/07/20 0538  WBC 4.0 4.3 3.7*  NEUTROABS 2.9 2.8 1.7  HGB 14.1 13.4 12.3  HCT 41.8 39.4 36.6  MCV 90.3 90.4 90.4  PLT 150 115* 127*   Cardiac Enzymes: No results for input(s):  CKTOTAL, CKMB, CKMBINDEX, TROPONINI in the last 168 hours. BNP: Invalid input(s): POCBNP CBG: Recent Labs  Lab 12/06/20 1700 12/06/20 1813 12/06/20 2048 12/06/20 2206 12/07/20 0837  GLUCAP 132* 130* 169* 128* 228*    Time coordinating discharge:  36 minutes  Signed:  Catarina Hartshornavid Irish Breisch, DO [facility] Pager: [phone] 12/07/2020, 10:01 AM

## 2021-03-01 ENCOUNTER — Encounter (HOSPITAL_COMMUNITY): Payer: Self-pay | Admitting: Emergency Medicine

## 2021-03-01 ENCOUNTER — Emergency Department (HOSPITAL_COMMUNITY)
Admission: EM | Admit: 2021-03-01 | Discharge: 2021-03-02 | Disposition: A | Discharge status: Home / Self Care | Payer: Medicaid Other | Attending: Emergency Medicine | Admitting: Emergency Medicine

## 2021-03-01 ENCOUNTER — Other Ambulatory Visit: Payer: Self-pay

## 2021-03-01 DIAGNOSIS — Z794 Long term (current) use of insulin: Secondary | ICD-10-CM | POA: Insufficient documentation

## 2021-03-01 DIAGNOSIS — R112 Nausea with vomiting, unspecified: Secondary | ICD-10-CM | POA: Insufficient documentation

## 2021-03-01 DIAGNOSIS — K0889 Other specified disorders of teeth and supporting structures: Secondary | ICD-10-CM

## 2021-03-01 DIAGNOSIS — Z8616 Personal history of COVID-19: Secondary | ICD-10-CM | POA: Insufficient documentation

## 2021-03-01 DIAGNOSIS — E039 Hypothyroidism, unspecified: Secondary | ICD-10-CM | POA: Insufficient documentation

## 2021-03-01 DIAGNOSIS — E1010 Type 1 diabetes mellitus with ketoacidosis without coma: Secondary | ICD-10-CM | POA: Insufficient documentation

## 2021-03-01 MED ORDER — ONDANSETRON HCL 4 MG PO TABS: 4.0000 mg | ORAL_TABLET | Freq: Four times a day (QID) | ORAL | 0 refills | Status: AC

## 2021-03-01 MED ORDER — OXYCODONE-ACETAMINOPHEN 5-325 MG PO TABS: 2.0000 | ORAL_TABLET | ORAL | 0 refills | Status: DC | PRN

## 2021-03-01 MED ORDER — ONDANSETRON 8 MG PO TBDP
8.0000 mg | ORAL_TABLET | Freq: Once | ORAL | Status: AC
  Administered 2021-03-01: 8 mg via ORAL
  Filled 2021-03-01: qty 1

## 2021-03-01 NOTE — ED Provider Notes (Signed)
[facility] EMERGENCY DEPARTMENT Provider Note   CSN: [id number] Arrival date & time: 03/01/21  2314     History Chief Complaint  Patient presents with  . Dental Pain    Tammy Higgins is a 25 y.o. female.  Had two teeth extracted today. Has vomited 5 times since, can't hold down pain meds. Complaining of pain at extraction site. Was bleeding earlier, stopped now. Can't take ibuprofen due to Von Willebrands.        Past Medical History:  Diagnosis Date  . Hypothyroidism   . Type 1 diabetes (HCC)   . Von Willebrand disease, type I [facility])     Patient Active Problem List   Diagnosis Date Noted  . COVID-19 virus infection 12/06/2020  . Thrombocytopenia (HCC) 12/06/2020  . COVID-19   . DKA, type 1 (HCC) 12/05/2020  . Nausea with vomiting 03/29/2020  . Diarrhea 03/29/2020  . Emesis, persistent 02/29/2020  . Type 1 diabetes mellitus with ketoacidosis without coma (HCC) 02/29/2020  . Von Willebrand disease (HCC) 02/29/2020  . Gastroenteritis 02/29/2020  . Cannabis abuse 02/29/2020  . Cannabis hyperemesis syndrome concurrent with and due to cannabis abuse (HCC) 02/29/2020  . UTI (urinary tract infection) 02/29/2020  . Pyelonephritis 02/29/2020    Past Surgical History:  Procedure Laterality Date  . INCISION AND DRAINAGE FOOT Left   . TONSILLECTOMY       OB History   No obstetric history on file.     No family history on file.  Social History   Tobacco Use  . Smoking status: Never Smoker  . Smokeless tobacco: Never Used  Vaping Use  . Vaping Use: Never used  Substance Use Topics  . Alcohol use: Yes    Comment: occasionally  . Drug use: Not Currently    Types: Marijuana    Home Medications Prior to Admission medications   Medication Sig Start Date End Date Taking? Authorizing Provider  ondansetron (ZOFRAN) 4 MG tablet Take 1 tablet (4 mg total) by mouth every 6 (six) hours. 03/01/21  Yes Adrinne Sze, Canary Brim, MD  oxyCODONE-acetaminophen (PERCOCET)  5-325 MG tablet Take 2 tablets by mouth every 4 (four) hours as needed. 03/01/21  Yes Demiyah Fischbach, Canary Brim, MD  insulin aspart (NOVOLOG) 100 UNIT/ML injection Inject 2-7 Units into the skin 3 (three) times daily before meals. Uses according to a sliding scale at home.    [provider]  insulin glargine (LANTUS) 100 UNIT/ML injection Inject 0.15 mLs (15 Units total) into the skin at bedtime. 12/07/20   Catarina Hartshorn, MD  magnesium oxide (MAG-OX) 400 (241.3 Mg) MG tablet Take 1 tablet (400 mg total) by mouth daily. 12/07/20   Catarina Hartshorn, MD    Allergies    Patient has no known allergies.  Review of Systems   Review of Systems  HENT: Positive for dental problem.   Gastrointestinal: Positive for nausea and vomiting.  All other systems reviewed and are negative.   Physical Exam Updated Vital Signs BP 131/81 (BP Location: Right Arm)   Pulse (!) 107   Temp 98.7 F (37.1 C) (Oral)   Resp 18   Ht 5\' 3"  (1.6 m)   Wt 51.7 kg   LMP 01/30/2021   SpO2 98%   BMI 20.19 kg/m   Physical Exam Vitals and nursing note reviewed.  Constitutional:      General: She is not in acute distress.    Appearance: Normal appearance. She is well-developed.  HENT:     Head: Normocephalic and atraumatic.  Right Ear: Hearing normal.     Left Ear: Hearing normal.     Nose: Nose normal.  Eyes:     Conjunctiva/sclera: Conjunctivae normal.     Pupils: Pupils are equal, round, and reactive to light.     Comments: Left lower mad right upper extraction sites look good, no bleeding or swelling  Cardiovascular:     Rate and Rhythm: Regular rhythm.     Heart sounds: S1 normal and S2 normal. No murmur heard. No friction rub. No gallop.   Pulmonary:     Effort: Pulmonary effort is normal. No respiratory distress.     Breath sounds: Normal breath sounds.  Chest:     Chest wall: No tenderness.  Abdominal:     General: Bowel sounds are normal.     Palpations: Abdomen is soft.     Tenderness: There is  no abdominal tenderness. There is no guarding or rebound. Negative signs include Murphy's sign and McBurney's sign.     Hernia: No hernia is present.  Musculoskeletal:        General: Normal range of motion.     Cervical back: Normal range of motion and neck supple.  Skin:    General: Skin is warm and dry.     Findings: No rash.  Neurological:     Mental Status: She is alert and oriented to person, place, and time.     GCS: GCS eye subscore is 4. GCS verbal subscore is 5. GCS motor subscore is 6.     Cranial Nerves: No cranial nerve deficit.     Sensory: No sensory deficit.     Coordination: Coordination normal.  Psychiatric:        Speech: Speech normal.        Behavior: Behavior normal.        Thought Content: Thought content normal.     ED Results / Procedures / Treatments   Labs (all labs ordered are listed, but only abnormal results are displayed) Labs Reviewed - No data to display  EKG None  Radiology No results found.  Procedures Procedures   Medications Ordered in ED Medications  ondansetron (ZOFRAN-ODT) disintegrating tablet 8 mg (has no administration in time range)    ED Course  I have reviewed the triage vital signs and the nursing notes.  Pertinent labs & imaging results that were available during my care of the patient were reviewed by me and considered in my medical decision making (see chart for details).    MDM Rules/Calculators/A&P                          Patient with dental pain after tooth extractions that occurred earlier.  She does have a history of von Willebrand's disease.  She had some bleeding earlier that resolved after she used Amicar.  No bleeding currently.  No signs of infection.  Vomiting likely secondary to hydrocodone use.  Change oxycodone, give Zofran.  Final Clinical Impression(s) / ED Diagnoses Final diagnoses:  Pain, dental  Non-intractable vomiting with nausea, unspecified vomiting type    Rx / DC Orders ED Discharge  Orders         Ordered    oxyCODONE-acetaminophen (PERCOCET) 5-325 MG tablet  Every 4 hours PRN        03/01/21 2335    ondansetron (ZOFRAN) 4 MG tablet  Every 6 hours        03/01/21 2336  Gilda Crease, MD 03/01/21 [phone]

## 2021-03-01 NOTE — ED Triage Notes (Signed)
Pt c/o dental pain and vomiting since having 2 teeth removed today. Pt states it is the pain meds are making her vomit.

## 2021-03-02 MED FILL — Oxycodone w/ Acetaminophen Tab 5-325 MG: ORAL | Qty: 6 | Status: AC

## 2021-04-09 IMAGING — CT CT ABD-PELV W/ CM
2 of 4 series · 15 of 46 positions shown, 17 images · IV contrast (Omnipaque or Isovue)
Comparison: None.

CLINICAL DATA: Nausea and vomiting for 2 days

EXAM:
CT ABDOMEN AND PELVIS WITH CONTRAST
TECHNIQUE: Multidetector CT imaging of the abdomen and pelvis was performed
using the standard protocol following bolus administration of
intravenous contrast.
CONTRAST:  100mL OMNIPAQUE IOHEXOL 300 MG/ML  SOLN

[Series 2: axial st · axial · 0.69mm/px · z∈[+689,+1094]mm · 12 of 91 slices shown, 14 images]
[im 5/91  soft-tissue]
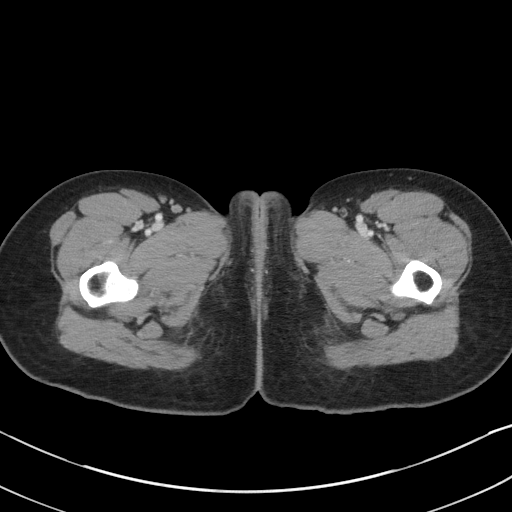
[im 5/91  bone]
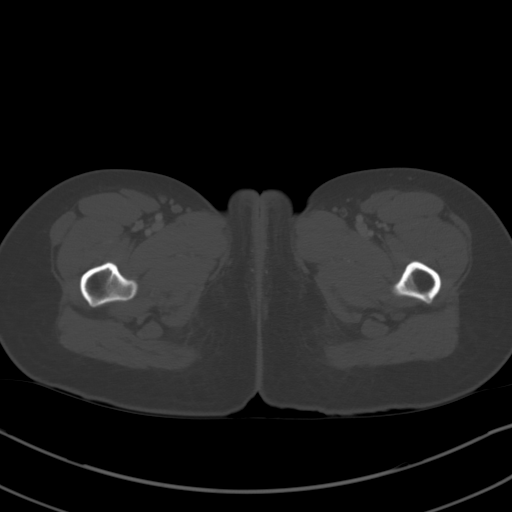
[im 13/91  soft-tissue]
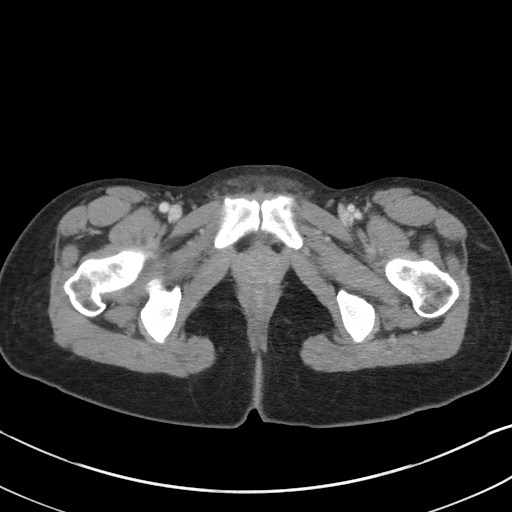
[im 22/91  soft-tissue]
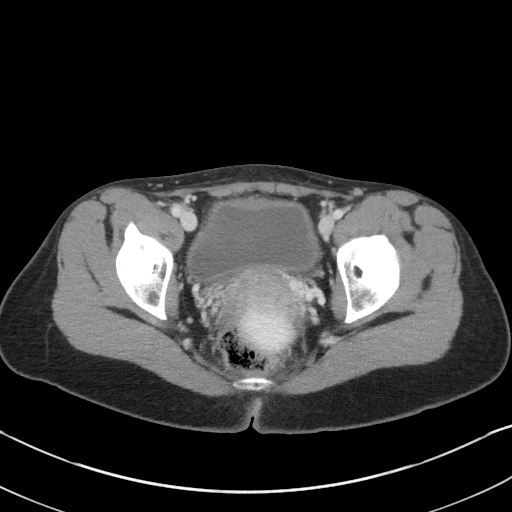
[im 26/91  soft-tissue]
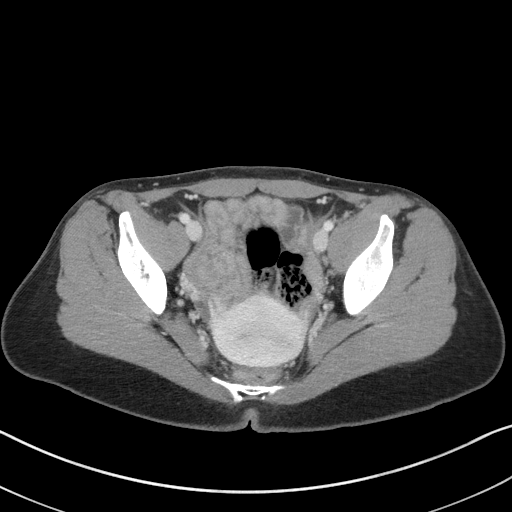
[im 35/91  soft-tissue]
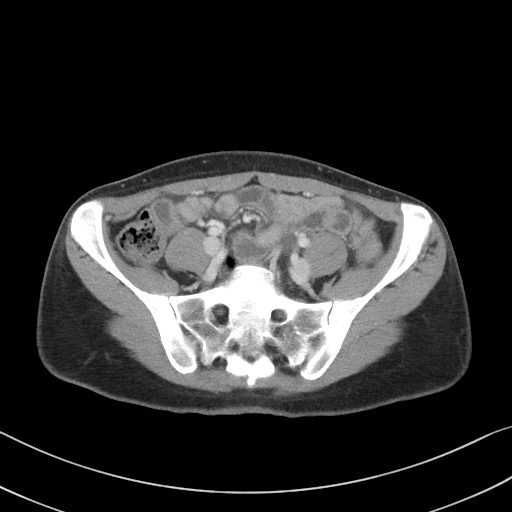
[im 43/91  soft-tissue]
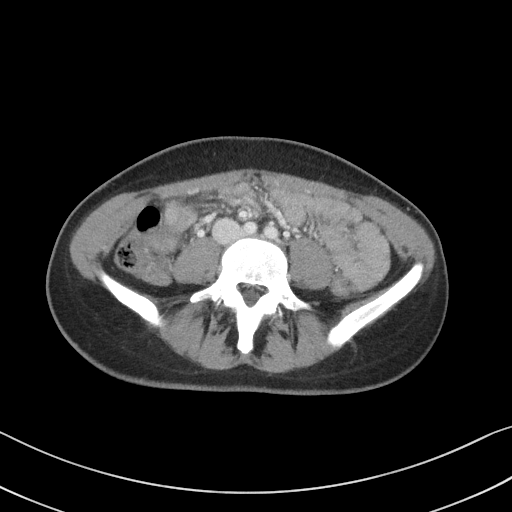
[im 48/91  soft-tissue]
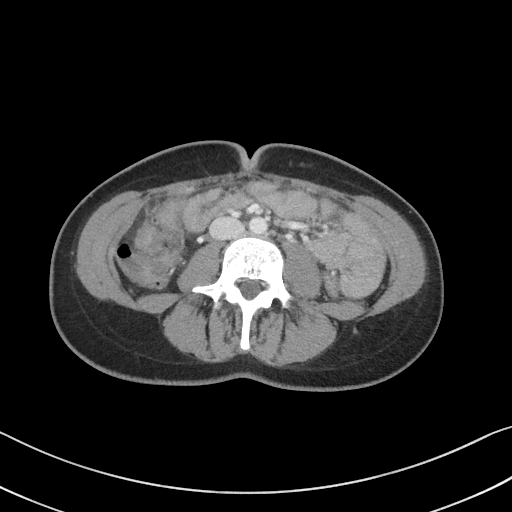
[im 56/91  soft-tissue]
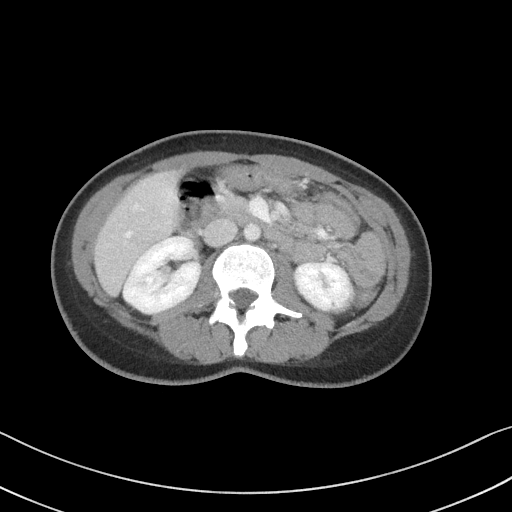
[im 65/91  soft-tissue]
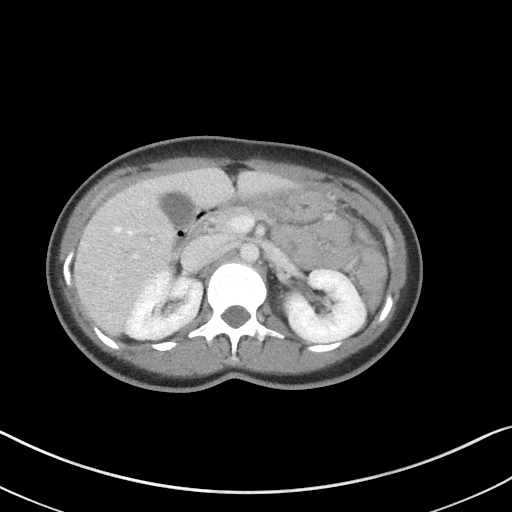
[im 65/91  bone]
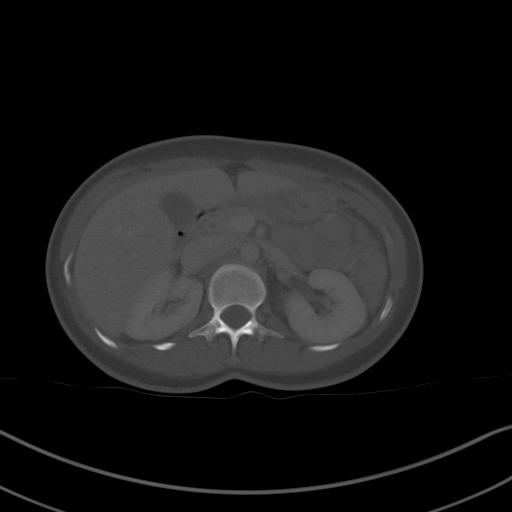
[im 69/91  soft-tissue]
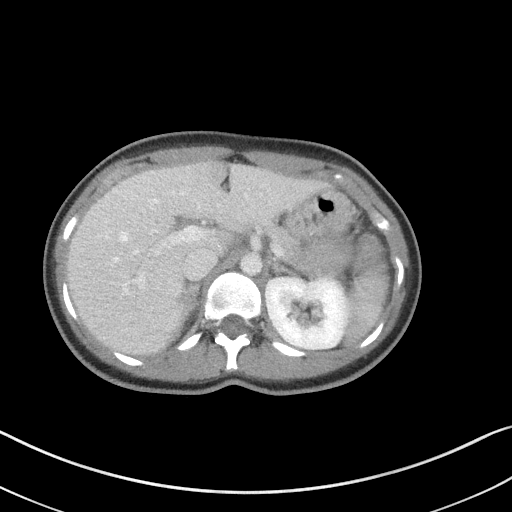
[im 78/91  soft-tissue]
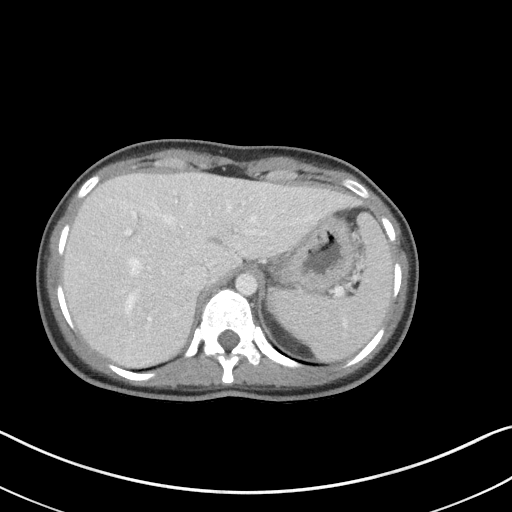
[im 86/91  soft-tissue]
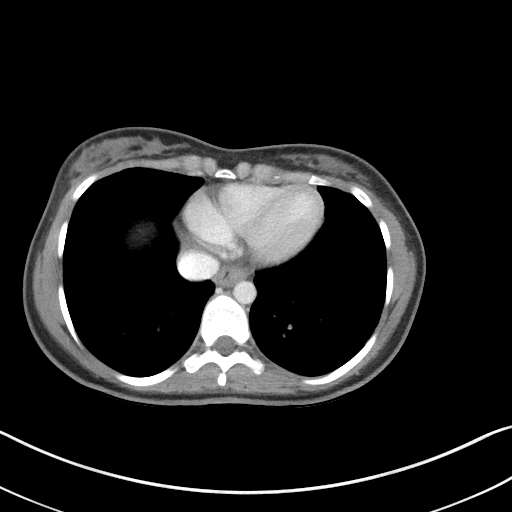

[Series 5: coronal st · coronal · 0.64mm/px · 3 of 85 slices shown]
[im 29/85  soft-tissue]
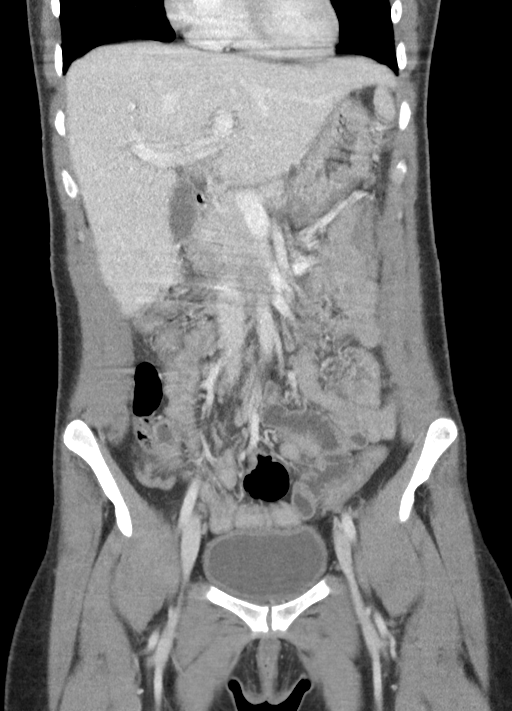
[im 38/85  soft-tissue]
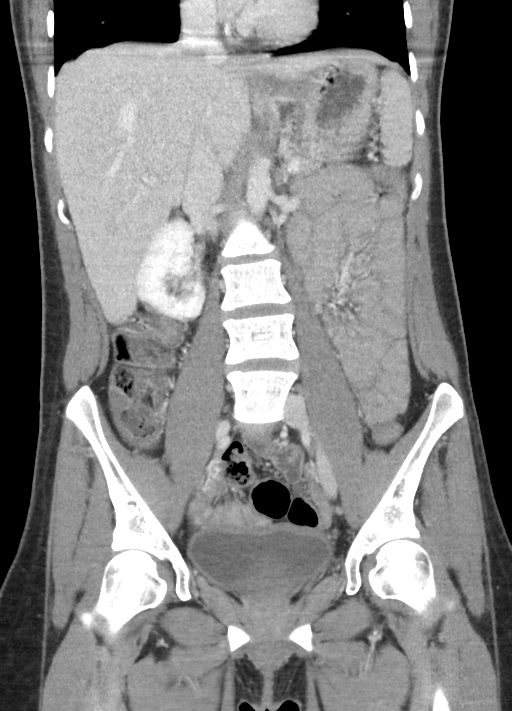
[im 47/85  soft-tissue]
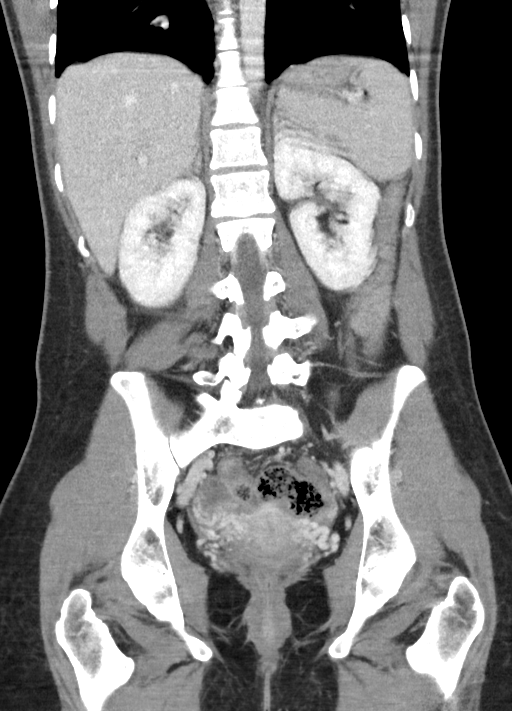

[15 of 46 positions shown; findings below may reference images not displayed]

FINDINGS: Technical note: Examination is mildly degraded by patient motion.

Lower chest: No acute abnormality.

Hepatobiliary: No focal liver abnormality is seen. No gallstones,
gallbladder wall thickening, or biliary dilatation.

Pancreas: Grossly unremarkable.

Spleen: Normal in size without focal abnormality.

Adrenals/Urinary Tract: Unremarkable adrenal glands. Small
wedge-shaped area of hypoenhancement of the midpole of the left
kidney (series 5, image 50). No hydronephrosis. No renal stone. No
perinephric stranding. Circumferential urinary bladder wall
thickening.

Stomach/Bowel: Stomach is within normal limits. Multiple
fluid-filled, nondilated loops of small bowel with mild long segment
bowel wall thickening suggestive of a nonspecific enteritis.
Appendix appears normal (series 6, images 36-42). No evidence of
focal bowel wall thickening, distention, or inflammatory changes.

Vascular/Lymphatic: No significant vascular findings are present. No
enlarged abdominal or pelvic lymph nodes.

Reproductive: Retroverted uterus. No adnexal mass.

Other: Trace fluid within the cul-de-sac, which may be physiologic.
No abdominal wall hernia.

Musculoskeletal: No acute or significant osseous findings.
IMPRESSION: 1. Small wedge-shaped area of hypoenhancement involving the midpole
of the left kidney, suspicious for pyelonephritis.
2. Circumferential urinary bladder wall thickening, suggestive of
cystitis. Correlate with urinalysis.
3. Multiple fluid-filled, nondilated loops of small bowel with mild
long segment bowel wall thickening suggestive of a nonspecific
enteritis.
4. Trace fluid within the cul-de-sac, likely physiologic.

## 2021-12-12 ENCOUNTER — Other Ambulatory Visit (HOSPITAL_COMMUNITY): Payer: Self-pay | Admitting: Family Medicine

## 2021-12-12 DIAGNOSIS — N63 Unspecified lump in unspecified breast: Secondary | ICD-10-CM

## 2021-12-20 ENCOUNTER — Other Ambulatory Visit (HOSPITAL_COMMUNITY): Payer: Self-pay | Admitting: Family Medicine

## 2021-12-20 ENCOUNTER — Ambulatory Visit (HOSPITAL_COMMUNITY)
Admission: RE | Admit: 2021-12-20 | Discharge: 2021-12-20 | Disposition: A | Discharge status: Home / Self Care | Payer: 59 | Source: Ambulatory Visit | Attending: Family Medicine | Admitting: Family Medicine

## 2021-12-20 ENCOUNTER — Other Ambulatory Visit: Payer: Self-pay

## 2021-12-20 DIAGNOSIS — R928 Other abnormal and inconclusive findings on diagnostic imaging of breast: Secondary | ICD-10-CM | POA: Insufficient documentation

## 2021-12-20 DIAGNOSIS — Z1239 Encounter for other screening for malignant neoplasm of breast: Secondary | ICD-10-CM | POA: Diagnosis not present

## 2021-12-20 DIAGNOSIS — N63 Unspecified lump in unspecified breast: Secondary | ICD-10-CM | POA: Insufficient documentation

## 2022-01-10 ENCOUNTER — Ambulatory Visit (HOSPITAL_COMMUNITY)
Admission: RE | Admit: 2022-01-10 | Discharge: 2022-01-10 | Disposition: A | Discharge status: Home / Self Care | Payer: 59 | Source: Ambulatory Visit | Attending: Family Medicine | Admitting: Family Medicine

## 2022-01-10 ENCOUNTER — Encounter (HOSPITAL_COMMUNITY): Payer: Self-pay

## 2022-01-10 ENCOUNTER — Other Ambulatory Visit: Payer: Self-pay

## 2022-01-10 ENCOUNTER — Other Ambulatory Visit (HOSPITAL_COMMUNITY): Payer: Self-pay | Admitting: Family Medicine

## 2022-01-10 DIAGNOSIS — R928 Other abnormal and inconclusive findings on diagnostic imaging of breast: Secondary | ICD-10-CM | POA: Insufficient documentation

## 2022-01-10 DIAGNOSIS — N62 Hypertrophy of breast: Secondary | ICD-10-CM | POA: Diagnosis not present

## 2022-01-10 DIAGNOSIS — N63 Unspecified lump in unspecified breast: Secondary | ICD-10-CM | POA: Diagnosis present

## 2022-01-10 MED ORDER — LIDOCAINE HCL (PF) 2 % IJ SOLN
INTRAMUSCULAR | Status: AC
  Administered 2022-01-10: 10 mL
  Filled 2022-01-10: qty 10

## 2022-01-10 MED ORDER — LIDOCAINE-EPINEPHRINE (PF) 1 %-1:200000 IJ SOLN
INTRAMUSCULAR | Status: AC
  Administered 2022-01-10: 10 mL
  Filled 2022-01-10: qty 30

## 2022-01-10 NOTE — Progress Notes (Signed)
PT tolerated left breast biopsy well today. PT verbalized understanding of discharge instructions and ambulatory at discharge, given discharge instructions and ice packs with no acute distress noted. Samples taken to lab at this time by ultrasound personnel.

## 2022-01-11 LAB — SURGICAL PATHOLOGY

## 2022-03-09 ENCOUNTER — Other Ambulatory Visit: Payer: Self-pay | Admitting: *Deleted

## 2022-03-09 DIAGNOSIS — N6320 Unspecified lump in the left breast, unspecified quadrant: Secondary | ICD-10-CM

## 2022-03-14 ENCOUNTER — Emergency Department (HOSPITAL_COMMUNITY)
Admission: EM | Admit: 2022-03-14 | Discharge: 2022-03-14 | Disposition: A | Discharge status: Home / Self Care | Payer: 59 | Attending: Emergency Medicine | Admitting: Emergency Medicine

## 2022-03-14 ENCOUNTER — Other Ambulatory Visit: Payer: Self-pay

## 2022-03-14 ENCOUNTER — Encounter (HOSPITAL_COMMUNITY): Payer: Self-pay

## 2022-03-14 DIAGNOSIS — E860 Dehydration: Secondary | ICD-10-CM | POA: Diagnosis not present

## 2022-03-14 DIAGNOSIS — R1110 Vomiting, unspecified: Secondary | ICD-10-CM | POA: Insufficient documentation

## 2022-03-14 DIAGNOSIS — E1165 Type 2 diabetes mellitus with hyperglycemia: Secondary | ICD-10-CM | POA: Diagnosis not present

## 2022-03-14 DIAGNOSIS — Z794 Long term (current) use of insulin: Secondary | ICD-10-CM | POA: Diagnosis not present

## 2022-03-14 LAB — CBC WITH DIFFERENTIAL/PLATELET
Abs Immature Granulocytes: 0.03 10*3/uL (ref 0.00–0.07)
Basophils Absolute: 0.1 10*3/uL (ref 0.0–0.1)
Basophils Relative: 1 %
Eosinophils Absolute: 0.2 10*3/uL (ref 0.0–0.5)
Eosinophils Relative: 2 %
HCT: 41.5 % (ref 36.0–46.0)
Hemoglobin: 14.3 g/dL (ref 12.0–15.0)
Immature Granulocytes: 0 %
Lymphocytes Relative: 27 %
Lymphs Abs: 2.3 10*3/uL (ref 0.7–4.0)
MCH: 30.8 pg (ref 26.0–34.0)
MCHC: 34.5 g/dL (ref 30.0–36.0)
MCV: 89.4 fL (ref 80.0–100.0)
Monocytes Absolute: 0.6 10*3/uL (ref 0.1–1.0)
Monocytes Relative: 7 %
Neutro Abs: 5.3 10*3/uL (ref 1.7–7.7)
Neutrophils Relative %: 63 %
Platelets: 207 10*3/uL (ref 150–400)
RBC: 4.64 MIL/uL (ref 3.87–5.11)
RDW: 12.5 % (ref 11.5–15.5)
WBC: 8.5 10*3/uL (ref 4.0–10.5)
nRBC: 0 % (ref 0.0–0.2)

## 2022-03-14 LAB — BASIC METABOLIC PANEL
Anion gap: 6 (ref 5–15)
BUN: 13 mg/dL (ref 6–20)
CO2: 27 mmol/L (ref 22–32)
Calcium: 8.9 mg/dL (ref 8.9–10.3)
Chloride: 105 mmol/L (ref 98–111)
Creatinine, Ser: 0.59 mg/dL (ref 0.44–1.00)
GFR, Estimated: >60 mL/min (ref >60)
Glucose, Bld: 254 mg/dL — ABNORMAL HIGH (ref 70–99)
Potassium: 3.9 mmol/L (ref 3.5–5.1)
Sodium: 138 mmol/L (ref 135–145)

## 2022-03-14 LAB — I-STAT BETA HCG BLOOD, ED (MC, WL, AP ONLY): I-stat hCG, quantitative: <5 m[IU]/mL (ref <5)

## 2022-03-14 LAB — CBG MONITORING, ED
Glucose-Capillary: 256 mg/dL — ABNORMAL HIGH (ref 70–99)
Glucose-Capillary: 154 mg/dL — ABNORMAL HIGH (ref 70–99)

## 2022-03-14 MED ORDER — KETOROLAC TROMETHAMINE 30 MG/ML IJ SOLN
30.0000 mg | Freq: Once | INTRAMUSCULAR | Status: AC
  Administered 2022-03-14: 30 mg via INTRAVENOUS
  Filled 2022-03-14: qty 1

## 2022-03-14 MED ORDER — SODIUM CHLORIDE 0.9 % IV BOLUS
1000.0000 mL | Freq: Once | INTRAVENOUS | Status: AC
Start: 2022-03-14 — End: 2022-03-14
  Administered 2022-03-14: 1000 mL via INTRAVENOUS

## 2022-03-14 MED ORDER — ONDANSETRON 4 MG PO TBDP: ORAL_TABLET | ORAL | 0 refills | Status: DC

## 2022-03-14 MED ORDER — ONDANSETRON HCL 4 MG/2ML IJ SOLN
4.0000 mg | Freq: Once | INTRAMUSCULAR | Status: AC
  Administered 2022-03-14: 4 mg via INTRAVENOUS
  Filled 2022-03-14: qty 2

## 2022-03-14 MED ORDER — SODIUM CHLORIDE 0.9 % IV BOLUS
1000.0000 mL | Freq: Once | INTRAVENOUS | Status: AC
  Administered 2022-03-14: 1000 mL via INTRAVENOUS

## 2022-03-14 NOTE — ED Provider Notes (Signed)
?[place] ?Provider Note ? ? ?CSN: [id number] ?Arrival date & time: 03/14/22  0505 ? ?  ? ?History ? ?Chief Complaint  ?Patient presents with  ? Emesis  ? ? ?Tammy Higgins is a 26 y.o. female. ? ?Patient complains of vomiting.  Patient has a history of diabetes..  Patient states when she has her menses she is has a lot of vomiting ? ?The history is provided by the patient and medical records. No language interpreter was used.  ?Emesis ?Severity:  Moderate ?Timing:  Intermittent ?Quality:  Bilious material ?Able to tolerate:  Solids ?Progression:  Unchanged ?Chronicity:  Recurrent ?Recent urination:  Normal ?Context: not post-tussive   ?Associated symptoms: no abdominal pain, no cough, no diarrhea and no headaches   ? ?  ? ?Home Medications ?Prior to Admission medications   ?Medication Sig Start Date End Date Taking? Authorizing Provider  ?ondansetron (ZOFRAN-ODT) 4 MG disintegrating tablet 4mg  ODT q4 hours prn nausea/vomit 03/14/22  Yes Milton Ferguson, MD  ?insulin aspart (NOVOLOG) 100 UNIT/ML injection Inject 2-7 Units into the skin 3 (three) times daily before meals. Uses according to a sliding scale at home.    [provider]  ?insulin glargine (LANTUS) 100 UNIT/ML injection Inject 0.15 mLs (15 Units total) into the skin at bedtime. 12/07/20   Orson Eva, MD  ?magnesium oxide (MAG-OX) 400 (241.3 Mg) MG tablet Take 1 tablet (400 mg total) by mouth daily. 12/07/20   Orson Eva, MD  ?ondansetron (ZOFRAN) 4 MG tablet Take 1 tablet (4 mg total) by mouth every 6 (six) hours. 03/01/21   Orpah Greek, MD  ?oxyCODONE-acetaminophen (PERCOCET) 5-325 MG tablet Take 2 tablets by mouth every 4 (four) hours as needed. 03/01/21   Orpah Greek, MD  ?   ? ?Allergies    ?Patient has no known allergies.   ? ?Review of Systems   ?Review of Systems  ?Constitutional:  Negative for appetite change and fatigue.  ?HENT:  Negative for congestion, ear discharge and sinus pressure.   ?Eyes:   Negative for discharge.  ?Respiratory:  Negative for cough.   ?Cardiovascular:  Negative for chest pain.  ?Gastrointestinal:  Positive for vomiting. Negative for abdominal pain and diarrhea.  ?Genitourinary:  Negative for frequency and hematuria.  ?Musculoskeletal:  Negative for back pain.  ?Skin:  Negative for rash.  ?Neurological:  Negative for seizures and headaches.  ?Psychiatric/Behavioral:  Negative for hallucinations.   ? ?Physical Exam ?Updated Vital Signs ?BP 106/62   Pulse 84   Temp 98.2 ?F (36.8 ?C) Comment: Simultaneous filing. User may not have seen previous data.  Resp 18   Ht 5\' 3"  (1.6 m)   Wt 56.7 kg   LMP 03/14/2022   SpO2 99%   BMI 22.14 kg/m?  ?Physical Exam ?Vitals and nursing note reviewed.  ?Constitutional:   ?   Appearance: She is well-developed.  ?HENT:  ?   Head: Normocephalic.  ?   Nose: Nose normal.  ?Eyes:  ?   General: No scleral icterus. ?   Conjunctiva/sclera: Conjunctivae normal.  ?Neck:  ?   Thyroid: No thyromegaly.  ?Cardiovascular:  ?   Rate and Rhythm: Normal rate and regular rhythm.  ?   Heart sounds: No murmur heard. ?  No friction rub. No gallop.  ?Pulmonary:  ?   Breath sounds: No stridor. No wheezing or rales.  ?Chest:  ?   Chest wall: No tenderness.  ?Abdominal:  ?   General: There is no distension.  ?  Tenderness: There is no abdominal tenderness. There is no rebound.  ?Musculoskeletal:     ?   General: Normal range of motion.  ?   Cervical back: Neck supple.  ?Lymphadenopathy:  ?   Cervical: No cervical adenopathy.  ?Skin: ?   Findings: No erythema or rash.  ?Neurological:  ?   Mental Status: She is alert and oriented to person, place, and time.  ?   Motor: No abnormal muscle tone.  ?   Coordination: Coordination normal.  ?Psychiatric:     ?   Behavior: Behavior normal.  ? ? ?ED Results / Procedures / Treatments   ?Labs ?(all labs ordered are listed, but only abnormal results are displayed) ?Labs Reviewed  ?BASIC METABOLIC PANEL - Abnormal; Notable for the  following components:  ?    Result Value  ? Glucose, Bld 254 (*)   ? All other components within normal limits  ?CBG MONITORING, ED - Abnormal; Notable for the following components:  ? Glucose-Capillary 256 (*)   ? All other components within normal limits  ?CBG MONITORING, ED - Abnormal; Notable for the following components:  ? Glucose-Capillary 154 (*)   ? All other components within normal limits  ?CBC WITH DIFFERENTIAL/PLATELET  ?I-STAT BETA HCG BLOOD, ED ([facility], [facility], [facility] ONLY)  ? ? ?EKG ?None ? ?Radiology ?No results found. ? ?Procedures ?Procedures  ? ? ?Medications Ordered in ED ?Medications  ?sodium chloride 0.9 % bolus 1,000 mL (0 mLs Intravenous Stopped 03/14/22 0809)  ?ondansetron [facility]) injection 4 mg (4 mg Intravenous Given 03/14/22 0625)  ?sodium chloride 0.9 % bolus 1,000 mL (0 mLs Intravenous Stopped 03/14/22 1022)  ?ketorolac (TORADOL) 30 MG/ML injection 30 mg (30 mg Intravenous Given 03/14/22 0809)  ? ? ?ED Course/ Medical Decision Making/ A&P ?  ?                        ?Medical Decision Making ?Risk ?Prescription drug management. ? ?This patient presents to the ED for concern of vomiting, this involves an extensive number of treatment options, and is a complaint that carries with it a high risk of complications and morbidity.  The differential diagnosis includes gastritis, DKA ? ? ?Co morbidities that complicate the patient evaluation ? ?Diabetes ? ? ?Additional history obtained: ? ?Additional history obtained from patient ?External records from outside source obtained and reviewed including hospital record ? ? ?Lab Tests: ? ?I Ordered, and personally interpreted labs.  The pertinent results include: CBC and chemistries which shows elevated glucose 254 ? ? ?Imaging Studies ordered: ? ?No imaging ? ?Cardiac Monitoring: / EKG: ? ?The patient was maintained on a cardiac monitor.  I personally viewed and interpreted the cardiac monitored which showed an underlying rhythm of: Normal sinus  rhythm ? ? ?Consultations Obtained: ? ?No consult ?Problem List / ED Course / Critical interventions / Medication management ? ?Diabetes, vomiting ?I ordered medication including normal saline and Zofran ?Reevaluation of the patient after these medicines showed that the patient improved ?I have reviewed the patients home medicines and have made adjustments as needed ? ? ?Social Determinants of Health: ? ?None ? ? ?Test / Admission - Considered: ? ?None ? ?Patient with vomiting and dehydration.  She will be discharged home with Zofran.  Patient improved with IV fluids. ? ? ? ? ? ? ? ?Final Clinical Impression(s) / ED Diagnoses ?Final diagnoses:  ?Dehydration  ? ? ?Rx / DC Orders ?ED Discharge Orders   ? ?  Ordered  ?  ondansetron (ZOFRAN-ODT) 4 MG disintegrating tablet       ? 03/14/22 1025  ? ?  ?  ? ?  ? ? ?  ?Milton Ferguson, MD ?03/14/22 1711 ? ?

## 2022-03-14 NOTE — Discharge Instructions (Signed)
Drink plenty of fluids and follow-up with your family doctor if any problems 

## 2022-03-14 NOTE — ED Triage Notes (Signed)
Pt she started her menstrual period this morning and gets sick every time she starts. Pt c/o nausea and vomiting. Believes she is dehydrated.   ?

## 2022-03-16 ENCOUNTER — Other Ambulatory Visit: Payer: Self-pay

## 2022-03-16 ENCOUNTER — Encounter: Payer: Self-pay | Admitting: Surgery

## 2022-03-16 ENCOUNTER — Ambulatory Visit (INDEPENDENT_AMBULATORY_CARE_PROVIDER_SITE_OTHER): Payer: 59 | Admitting: Surgery

## 2022-03-16 VITALS — BP 104/70 | HR 96 | Temp 98.3°F | Resp 12 | Ht 63.0 in | Wt 120.0 lb

## 2022-03-16 DIAGNOSIS — N6320 Unspecified lump in the left breast, unspecified quadrant: Secondary | ICD-10-CM | POA: Diagnosis not present

## 2022-03-16 DIAGNOSIS — N6489 Other specified disorders of breast: Secondary | ICD-10-CM

## 2022-03-16 NOTE — Progress Notes (Signed)
[facility] History and Physical ? 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 ?Chief Complaint   ?New Patient (Initial Visit) ?  ? ? ?Tammy Higgins is a 26 y.o. female.  ?HPI: Patient presents for evaluation of a left breast mass.  She first noted the mass about 2 years ago, and at that time it was significantly smaller than it is currently.  She does confirm that the size varies with her menstrual period.  She denies any other breast issues.  She denies any nipple discharge or overlying skin changes.  She does have some tenderness to palpation of this left breast mass.  She denies any family history of breast cancer.  She for started her menses at the age of 27.  She has never been pregnant and has had no children.  She denies any breast biopsies other than the recent biopsy she had of her left breast mass.  She has a past medical history significant for type 1 diabetes and von Willebrand's disease.  Her surgical history is significant for a foot surgery and dental procedures.  All of her procedures have been performed at [facility], as this is where her hematologist works, however she states her insurance was not going to cover left breast mass surgery.  Prior to any procedures, she has been administered DDAVP, and she states she also takes tranexamic acid.  She denies use tobacco products.  She will occasionally drink alcohol and smokes marijuana weekly. ? ?Past Medical History:  ?Diagnosis Date  ? Hypothyroidism   ? Type 1 diabetes (HCC)   ? Von Willebrand disease, type I (HCC)   ? ? ?Past Surgical History:  ?Procedure Laterality Date  ? INCISION AND DRAINAGE FOOT Left   ? TONSILLECTOMY    ? ? ?No family history on file. ? ?Social History  ? ?Tobacco Use  ? Smoking status: Never  ? Smokeless tobacco: Never  ?Vaping Use  ? Vaping Use: Never used  ?Substance Use Topics  ? Alcohol use: Yes  ?  Comment: occasionally  ? Drug use:  Not Currently  ?  Types: Marijuana  ? ? ?Medications: I have reviewed the patient's current medications. ?Allergies as of 03/16/2022   ?No Known Allergies ?  ? ?  ?Medication List  ?  ? ?  ? Accurate as of March 16, 2022  1:08 PM. If you have any questions, ask your nurse or doctor.  ?  ?  ? ?  ? ?STOP taking these medications   ? ?insulin aspart 100 UNIT/ML injection ?Commonly known as: novoLOG ?Stopped by: Gustavus Messing Chike Farrington, DO ?  ?ondansetron 4 MG disintegrating tablet ?Commonly known as: ZOFRAN-ODT ?Stopped by: Lewie Chamber, DO ?  ?oxyCODONE-acetaminophen 5-325 MG tablet ?Commonly known as: Percocet ?Stopped by: Lewie Chamber, DO ?  ? ?  ? ?TAKE these medications   ? ?insulin glargine 100 UNIT/ML injection ?Commonly known as: LANTUS ?Inject 0.15 mLs (15 Units total) into the skin at bedtime. ?  ?insulin lispro 100 UNIT/ML KwikPen ?Commonly known as: HUMALOG ?as directed: BG value - 130 divided by 35 ?  ?magnesium oxide 400 (241.3 Mg) MG tablet ?Commonly known as: MAG-OX ?Take 1 tablet (400 mg total) by mouth daily. ?  ?ondansetron 4 MG tablet ?Commonly known as: ZOFRAN ?Take 1 tablet (4 mg total) by mouth every 6 (six) hours. ?  ? ?  ? ? ? ?ROS:  ?Constitutional: negative for chills, fatigue, and fevers ?Eyes: negative  for visual disturbance and pain ?Ears, nose, mouth, throat, and face: negative for ear drainage, sore throat, and sinus problems ?Respiratory: negative for cough, wheezing, and shortness of breath ?Cardiovascular: negative for chest pain and palpitations ?Gastrointestinal: negative for abdominal pain, nausea, reflux symptoms, and vomiting ?Genitourinary:negative for dysuria, frequency, and urinary retention ?Integument/breast: positive for breast lump ?Hematologic/lymphatic: positive for bleeding, negative for lymphadenopathy ?Musculoskeletal:negative for back pain, neck pain, and joint pain ?Neurological: negative for dizziness, tremors, and numbness ?Endocrine: negative  for temperature intolerance ? ?Last menstrual period 03/14/2022. ?Physical Exam ?Vitals reviewed.  ?Constitutional:   ?   Appearance: Normal appearance.  ?HENT:  ?   Head: Normocephalic and atraumatic.  ?Eyes:  ?   Extraocular Movements: Extraocular movements intact.  ?   Pupils: Pupils are equal, round, and reactive to light.  ?Cardiovascular:  ?   Rate and Rhythm: Normal rate and regular rhythm.  ?Pulmonary:  ?   Effort: Pulmonary effort is normal.  ?   Breath sounds: Normal breath sounds.  ?Chest:  ?Breasts: ?   Right: Normal.  ?   Left: Mass and tenderness present. No nipple discharge or skin change.  ?   Comments: Large breast mass at the 10 o'clock position of the left breast, 6 to 7 cm in size, mass makes up a large portion of the patient's left breast ?Abdominal:  ?   General: There is no distension.  ?   Palpations: Abdomen is soft.  ?   Tenderness: There is no abdominal tenderness.  ?Musculoskeletal:     ?   General: Normal range of motion.  ?   Cervical back: Normal range of motion.  ?Lymphadenopathy:  ?   Upper Body:  ?   Right upper body: No supraclavicular or axillary adenopathy.  ?   Left upper body: No supraclavicular or axillary adenopathy.  ?Skin: ?   General: Skin is warm and dry.  ?Neurological:  ?   General: No focal deficit present.  ?   Mental Status: She is alert and oriented to person, place, and time.  ?Psychiatric:     ?   Mood and Affect: Mood normal.     ?   Behavior: Behavior normal.  ? ? ?Results: ?Left Breast Ultrasound (12/20/21): ?IMPRESSION: ?Indeterminate 6.5 cm palpable mass in the left breast demonstrating ?imaging features most suggestive of a fibroadenoma, however given ?large size tissue sampling is warranted to rule out phyllodes tumor. ?  ?RECOMMENDATION: ?Recommend ultrasound-guided core biopsy of the mass in the left ?breast at the 12 o'clock position. ?  ?I have discussed the findings and recommendations with the patient. ?If applicable, a reminder letter will be sent to  the patient ?regarding the next appointment. ?  ?BI-RADS CATEGORY  4: Suspicious. ? ?Assessment & Plan:  ?Tammy Higgins is a 26 y.o. 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 ? ?-After evaluating the patient, I explained that she will likely need a left breast mastectomy given the significant size of her mass in relation to the size of her breast.  The patient expressed that she does not want her breast to appear deformed, and would want reconstruction if mastectomy was necessary. ?-This patient would be a good candidate for immediate breast reconstruction versus breast lift at the time of her breast surgery ?-We will have the patient see plastic surgery for evaluation ?-Given that plastic surgery is unable to do a joint case at [facility], will also refer the patient to [facility] ? ?  All questions were answered to the satisfaction of the patient and family. ? ?Theophilus Kindsatherine Fern Asmar, DO ?[facility] ?[address] ?[place], [place] [id number] ?[phone] (office) ? ? ? ? ? ?

## 2022-03-16 NOTE — Patient Instructions (Signed)
What are the treatments for breast pain? °- Use less salt. °- Wear a supportive bra. °- Apply local heat to the painful area. °- Take over-the-counter pain relievers sparingly, as needed. °- Avoid caffeine. Well-designed studies have not shown that avoiding caffeine can treat breast pain. However, many women report significant improvement in their symptoms when they reduce their intake of tea, coffee, chocolate, and energy drinks. °- Try Vitamin E. Studies have not consistently shown benefits of vitamin E for treating breast pain, though some women find it helpful. Using vitamin E for a few weeks to see if it will help is unlikely to cause any harm. However, long-term use of vitamin E supplementation is not recommended for breast pain, as there are some studies suggesting this may not be safe. °- Try evening primrose oil. Similar to vitamin E, studies have not consistently shown evening primrose oil to be helpful in treating breast pain, though it does help some women. Evening primrose oil is found over-the-counter. Side effects might include nausea, diarrhea, and headaches. In the past, there was concern that certain patients might be at increased risk of seizures when taking this supplement, though this is now disputed. °- Try Omega-3 fatty acid. Though not proven to be effective in rigorous studies, some women find increased intake of fish oils/omega-3 supplements to be helpful. Natural dietary sources include: dark green leafy vegetables, ocean-raised ("wild") cold-water fish, flax, walnuts, and sesame. Omega-3 supplements are also available by prescription and over-the-counter. °- Give it time. Most commonly, pain goes away on its own after a few months, without the need for any treatment. ° °

## 2022-04-12 ENCOUNTER — Ambulatory Visit (INDEPENDENT_AMBULATORY_CARE_PROVIDER_SITE_OTHER): Payer: 59

## 2022-04-12 ENCOUNTER — Encounter: Payer: Self-pay | Admitting: Podiatry

## 2022-04-12 ENCOUNTER — Ambulatory Visit (INDEPENDENT_AMBULATORY_CARE_PROVIDER_SITE_OTHER): Payer: 59 | Admitting: Podiatry

## 2022-04-12 DIAGNOSIS — L84 Corns and callosities: Secondary | ICD-10-CM | POA: Diagnosis not present

## 2022-04-12 DIAGNOSIS — M778 Other enthesopathies, not elsewhere classified: Secondary | ICD-10-CM | POA: Diagnosis not present

## 2022-04-12 DIAGNOSIS — M79671 Pain in right foot: Secondary | ICD-10-CM

## 2022-04-12 DIAGNOSIS — M7752 Other enthesopathy of left foot: Secondary | ICD-10-CM

## 2022-04-12 DIAGNOSIS — M79672 Pain in left foot: Secondary | ICD-10-CM | POA: Diagnosis not present

## 2022-04-12 NOTE — Progress Notes (Cosign Needed)
This patient presents to the office with a callus on the bottom of her left heel;  This heel is painful when she walks and she experiences a fullness in her heel when she walks.  The is a diabetic on insulin.  She says she stepped on an object three years ago which developed into an infection.  She was treated with two different surgeries for incising and draining her infection  The care was provided by [facility].  She also says she had a skin graft to help with closure.  She says there is now pain due to the thickened skin lesion.  No redness, swelling or drainage.  She presents for an evaluation and treatment.  Vascular  Dorsalis pedis and posterior tibial pulses are palpable  B/L.  Capillary return  WNL.  Temperature gradient is  WNL.  Skin turgor  WNL  Sensorium  Senn Weinstein monofilament wire  WNL. Normal tactile sensation.  Nail Exam  Patient has normal nails with no evidence of bacterial or fungal infection.  Orthopedic  Exam  Muscle tone and muscle strength  WNL.  No limitations of motion feet  B/L.  No crepitus or joint effusion noted.  Foot type is unremarkable and digits show no abnormalities.  Bony prominences are unremarkable.  Skin  No open lesions.  Normal skin texture and turgor.  Quarter sized skin lesion at the site of her previous surgeries.   Skin lesion/callus due to previous surgery.  IE.  Discussed this condition with this patient.  Xrays reveal no evidence of FB or bony pathology left heel.  Xray of right heel was taken for comparison.  Dr.  Allena Katz was consulted and patient will follow up with him once her A1C is below  8.0 and she desires to consider surgery.   Helane Gunther DPM

## 2022-04-19 ENCOUNTER — Other Ambulatory Visit: Payer: Self-pay | Admitting: Podiatry

## 2022-04-19 DIAGNOSIS — M778 Other enthesopathies, not elsewhere classified: Secondary | ICD-10-CM

## 2022-04-19 DIAGNOSIS — M7752 Other enthesopathy of left foot: Secondary | ICD-10-CM

## 2022-04-20 ENCOUNTER — Encounter: Payer: Self-pay | Admitting: Plastic Surgery

## 2022-04-20 ENCOUNTER — Ambulatory Visit (INDEPENDENT_AMBULATORY_CARE_PROVIDER_SITE_OTHER): Payer: 59 | Admitting: Plastic Surgery

## 2022-04-20 VITALS — BP 120/77 | HR 97 | Ht 63.0 in | Wt 120.0 lb

## 2022-04-20 DIAGNOSIS — D6800 Von Willebrand disease, unspecified: Secondary | ICD-10-CM

## 2022-04-20 DIAGNOSIS — N63 Unspecified lump in unspecified breast: Secondary | ICD-10-CM

## 2022-04-20 DIAGNOSIS — E1010 Type 1 diabetes mellitus with ketoacidosis without coma: Secondary | ICD-10-CM | POA: Diagnosis not present

## 2022-04-20 DIAGNOSIS — N6459 Other signs and symptoms in breast: Secondary | ICD-10-CM | POA: Diagnosis not present

## 2022-04-20 NOTE — Progress Notes (Signed)
Patient ID: Tammy Higgins, female    DOB: [birth year], 26 y.o.   MRN: [id number]   Chief Complaint  Patient presents with   Consult   Breast Problem    The patient is a 26 year old female valuation of her breasts currently diagnosed with large of the left breast.  She is 5 feet 3 inches tall and weighs 120 pounds.  She is has a history of type I diabetic with her hemoglobin A1c checked 2 months ago which was 8 .  She also has von Willebrand's disorder.  She is seen by several physicians in the [facility] system.  She is a C cup and would like to be completely flat.  She would like her treatment to be bilateral mastectomies.   Review of Systems  Constitutional: Negative.   Respiratory: Negative.  Negative for chest tightness and shortness of breath.   Cardiovascular: Negative.   Gastrointestinal: Negative.   Endocrine: Negative.   Genitourinary: Negative.   Musculoskeletal: Negative.   Skin: Negative.   Psychiatric/Behavioral: Negative.     Past Medical History:  Diagnosis Date   Hypothyroidism    Type 1 diabetes (HCC)    Von Willebrand disease, type I (HCC)     Past Surgical History:  Procedure Laterality Date   INCISION AND DRAINAGE FOOT Left    TONSILLECTOMY        Current Outpatient Medications:    insulin glargine (LANTUS) 100 UNIT/ML injection, Inject 0.15 mLs (15 Units total) into the skin at bedtime., Disp: 10 mL, Rfl: 0   insulin lispro (HUMALOG) 100 UNIT/ML KwikPen, as directed: BG value - 130 divided by 35, Disp: , Rfl:    magnesium oxide (MAG-OX) 400 (241.3 Mg) MG tablet, Take 1 tablet (400 mg total) by mouth daily., Disp: 30 tablet, Rfl: 0   ondansetron (ZOFRAN) 4 MG tablet, Take 1 tablet (4 mg total) by mouth every 6 (six) hours., Disp: 12 tablet, Rfl: 0   Objective:   Vitals:   04/20/22 1542  BP: 120/77  Pulse: 97  SpO2: 100%    Physical Exam Vitals and nursing note reviewed.  Constitutional:      Appearance: Normal appearance.  HENT:      Head: Normocephalic and atraumatic.  Cardiovascular:     Rate and Rhythm: Normal rate.     Pulses: Normal pulses.  Pulmonary:     Effort: Pulmonary effort is normal.  Abdominal:     General: There is no distension.     Palpations: Abdomen is soft.  Musculoskeletal:        General: No swelling or deformity.  Skin:    General: Skin is warm.     Capillary Refill: Capillary refill takes less than 2 seconds.     Coloration: Skin is not jaundiced.     Findings: Lesion present. No bruising.  Neurological:     Mental Status: She is alert and oriented to person, place, and time.  Psychiatric:        Mood and Affect: Mood normal.        Behavior: Behavior normal.        Thought Content: Thought content normal.        Judgment: Judgment normal.    Assessment & Plan:  Type 1 diabetes mellitus with ketoacidosis without coma (HCC)  Von Willebrand disease (HCC)  Breast mass in female  The patient has not seen general surgery yet but is scheduled to see them in the next 2 weeks.  I  would like to talk to her after she has her visit with general surgery to see what is likely her option.  We talked about what a flap closure would look like if she underwent bilateral mastectomies.  At this point in time she does not want reconstruction or implants.  Pictures were obtained of the patient and placed in the chart with the patient's or guardian's permission.   Alena Bills Mavery Milling, DO

## 2022-04-21 ENCOUNTER — Telehealth: Payer: Self-pay

## 2022-04-21 ENCOUNTER — Encounter: Payer: Self-pay | Admitting: Plastic Surgery

## 2022-04-21 NOTE — Telephone Encounter (Signed)
Faxed information to [employer]-receipt confirmed.

## 2022-04-28 ENCOUNTER — Other Ambulatory Visit: Payer: Self-pay | Admitting: General Surgery

## 2022-05-02 ENCOUNTER — Telehealth: Payer: 59 | Admitting: Plastic Surgery

## 2022-06-23 NOTE — Progress Notes (Signed)
Surgical Instructions    Your procedure is scheduled on Wednesday, 07/05/22.  Report to [facility] Main Entrance "A" at 6:30 A.M., then check in with the Admitting office.  Call this number if you have problems the morning of surgery:  [phone]   If you have any questions prior to your surgery date call [phone]: Open Monday-Friday 8am-4pm    Remember:  Do not eat after midnight the night before your surgery  You may drink clear liquids until 5:30am the morning of your surgery.   Clear liquids allowed are: Water, Non-Citrus Juices (without pulp), Carbonated Beverages, Clear Tea, Black Coffee ONLY (NO MILK, CREAM OR POWDERED CREAMER of any kind), and Gatorade  Patient Instructions  The night before surgery:  No food after midnight. ONLY clear liquids after midnight   The day of surgery (if you have diabetes): Drink ONE (1) 12 oz G2 given to you in your pre admission testing appointment by 5:30am the morning of surgery. Drink in one sitting. Do not sip.  This drink was given to you during your hospital  pre-op appointment visit.  Nothing else to drink after completing the  12 oz bottle of G2.         If you have questions, please contact your surgeon's office.     Take these medicines the morning of surgery with A SIP OF WATER:  tranexamic acid (LYSTEDA)   As of today, STOP taking any Aspirin (unless otherwise instructed by your surgeon) Aleve, Naproxen, Ibuprofen, Motrin, Advil, Goody's, BC's, all herbal medications, fish oil, and all vitamins.  WHAT DO I DO ABOUT MY DIABETES MEDICATION?   Do not take oral diabetes medicines (pills) the morning of surgery.  THE NIGHT BEFORE SURGERY, take 10 units (80%) of insulin glargine (LANTUS)      THE MORNING OF SURGERY, If your CBG is greater than 220 mg/dL, you may take  of your sliding scale (insulin aspart (NOVOLOG)) dose of insulin.  The day of surgery, do not take other diabetes injectables, including Byetta  (exenatide), Bydureon (exenatide ER), Victoza (liraglutide), or Trulicity (dulaglutide).   HOW TO MANAGE YOUR DIABETES BEFORE AND AFTER SURGERY  Why is it important to control my blood sugar before and after surgery? Improving blood sugar levels before and after surgery helps healing and can limit problems. A way of improving blood sugar control is eating a healthy diet by:  Eating less sugar and carbohydrates  Increasing activity/exercise  Talking with your doctor about reaching your blood sugar goals High blood sugars (greater than 180 mg/dL) can raise your risk of infections and slow your recovery, so you will need to focus on controlling your diabetes during the weeks before surgery. Make sure that the doctor who takes care of your diabetes knows about your planned surgery including the date and location.  How do I manage my blood sugar before surgery? Check your blood sugar at least 4 times a day, starting 2 days before surgery, to make sure that the level is not too high or low.  Check your blood sugar the morning of your surgery when you wake up and every 2 hours until you get to the Short Stay unit.  If your blood sugar is less than 70 mg/dL, you will need to treat for low blood sugar: Do not take insulin. Treat a low blood sugar (less than 70 mg/dL) with  cup of clear juice (cranberry or apple), 4 glucose tablets, OR glucose gel. Recheck blood sugar in 15 minutes after  treatment (to make sure it is greater than 70 mg/dL). If your blood sugar is not greater than 70 mg/dL on recheck, call [phone] for further instructions. Report your blood sugar to the short stay nurse when you get to Short Stay.  If you are admitted to the hospital after surgery: Your blood sugar will be checked by the staff and you will probably be given insulin after surgery (instead of oral diabetes medicines) to make sure you have good blood sugar levels. The goal for blood sugar control after surgery is  80-180 mg/dL.           Do not wear jewelry or makeup. Do not wear lotions, powders, perfumes/cologne or deodorant. Do not shave 48 hours prior to surgery.   Do not bring valuables to the hospital. Do not wear nail polish, gel polish, artificial nails, or any other type of covering on natural nails (fingers and toes) If you have artificial nails or gel coating that need to be removed by a nail salon, please have this removed prior to surgery. Artificial nails or gel coating may interfere with anesthesia's ability to adequately monitor your vital signs.  [facility] is not responsible for any belongings or valuables.    Do NOT Smoke (Tobacco/Vaping)  24 hours prior to your procedure  If you use a CPAP at night, you may bring your mask for your overnight stay.   Contacts, glasses, hearing aids, dentures or partials may not be worn into surgery, please bring cases for these belongings   For patients admitted to the hospital, discharge time will be determined by your treatment team.   Patients discharged the day of surgery will not be allowed to drive home, and someone needs to stay with them for 24 hours.   SURGICAL WAITING ROOM VISITATION Patients having surgery or a procedure may have no more than 2 support people in the waiting area - these visitors may rotate.   Children under the age of 31 must have an adult with them who is not the patient. If the patient needs to stay at the hospital during part of their recovery, the visitor guidelines for inpatient rooms apply. Pre-op nurse will coordinate an appropriate time for 1 support person to accompany patient in pre-op.  This support person may not rotate.   Please refer to the [facility] website for the visitor guidelines for Inpatients (after your surgery is over and you are in a regular room).    Special instructions:    Oral Hygiene is also important to reduce your risk of infection.  Remember - BRUSH YOUR TEETH THE MORNING OF  SURGERY WITH YOUR REGULAR TOOTHPASTE   [facility]- Preparing For Surgery  Before surgery, you can play an important role. Because skin is not sterile, your skin needs to be as free of germs as possible. You can reduce the number of germs on your skin by washing with CHG (chlorahexidine gluconate) Soap before surgery.  CHG is an antiseptic cleaner which kills germs and bonds with the skin to continue killing germs even after washing.     Please do not use if you have an allergy to CHG or antibacterial soaps. If your skin becomes reddened/irritated stop using the CHG.  Do not shave (including legs and underarms) for at least 48 hours prior to first CHG shower. It is OK to shave your face.  Please follow these instructions carefully.     Shower the NIGHT BEFORE SURGERY and the MORNING OF SURGERY with  CHG Soap.   If you chose to wash your hair, wash your hair first as usual with your normal shampoo. After you shampoo, rinse your hair and body thoroughly to remove the shampoo.  Then [employer] and genitals (private parts) with your normal soap and rinse thoroughly to remove soap.  After that Use CHG Soap as you would any other liquid soap. You can apply CHG directly to the skin and wash gently with a scrungie or a clean washcloth.   Apply the CHG Soap to your body ONLY FROM THE NECK DOWN.  Do not use on open wounds or open sores. Avoid contact with your eyes, ears, mouth and genitals (private parts). Wash Face and genitals (private parts)  with your normal soap.   Wash thoroughly, paying special attention to the area where your surgery will be performed.  Thoroughly rinse your body with warm water from the neck down.  DO NOT shower/wash with your normal soap after using and rinsing off the CHG Soap.  Pat yourself dry with a CLEAN TOWEL.  Wear CLEAN PAJAMAS to bed the night before surgery  Place CLEAN SHEETS on your bed the night before your surgery  DO NOT SLEEP WITH PETS.   Day of  Surgery: Take a shower with CHG soap. Wear Clean/Comfortable clothing the morning of surgery Do not apply any deodorants/lotions.   Remember to brush your teeth WITH YOUR REGULAR TOOTHPASTE.    If you received a COVID test during your pre-op visit, it is requested that you wear a mask when out in public, stay away from anyone that may not be feeling well, and notify your surgeon if you develop symptoms. If you have been in contact with anyone that has tested positive in the last 10 days, please notify your surgeon.    Please read over the following fact sheets that you were given.

## 2022-06-26 ENCOUNTER — Encounter (HOSPITAL_COMMUNITY)
Admission: RE | Admit: 2022-06-26 | Discharge: 2022-06-26 | Disposition: A | Discharge status: Home / Self Care | Payer: 59 | Source: Ambulatory Visit | Attending: General Surgery | Admitting: General Surgery

## 2022-06-26 ENCOUNTER — Other Ambulatory Visit: Payer: Self-pay

## 2022-06-26 ENCOUNTER — Encounter (HOSPITAL_COMMUNITY): Payer: Self-pay

## 2022-06-26 ENCOUNTER — Other Ambulatory Visit: Payer: Self-pay | Admitting: General Surgery

## 2022-06-26 VITALS — BP 105/74 | HR 97 | Temp 98.4°F | Resp 20 | Ht 63.0 in | Wt 116.9 lb

## 2022-06-26 DIAGNOSIS — E108 Type 1 diabetes mellitus with unspecified complications: Secondary | ICD-10-CM | POA: Insufficient documentation

## 2022-06-26 DIAGNOSIS — E039 Hypothyroidism, unspecified: Secondary | ICD-10-CM | POA: Diagnosis not present

## 2022-06-26 DIAGNOSIS — N6320 Unspecified lump in the left breast, unspecified quadrant: Secondary | ICD-10-CM | POA: Insufficient documentation

## 2022-06-26 DIAGNOSIS — Z87891 Personal history of nicotine dependence: Secondary | ICD-10-CM | POA: Diagnosis not present

## 2022-06-26 DIAGNOSIS — Z794 Long term (current) use of insulin: Secondary | ICD-10-CM | POA: Diagnosis not present

## 2022-06-26 DIAGNOSIS — Z01818 Encounter for other preprocedural examination: Secondary | ICD-10-CM | POA: Insufficient documentation

## 2022-06-26 LAB — HEMOGLOBIN A1C
Hgb A1c MFr Bld: 9 % — ABNORMAL HIGH (ref 4.8–5.6)
Mean Plasma Glucose: 211.6 mg/dL

## 2022-06-26 LAB — BASIC METABOLIC PANEL
Anion gap: 9 (ref 5–15)
BUN: 12 mg/dL (ref 6–20)
CO2: 23 mmol/L (ref 22–32)
Calcium: 9.3 mg/dL (ref 8.9–10.3)
Chloride: 101 mmol/L (ref 98–111)
Creatinine, Ser: 0.72 mg/dL (ref 0.44–1.00)
GFR, Estimated: >60 mL/min (ref >60)
Glucose, Bld: 345 mg/dL — ABNORMAL HIGH (ref 70–99)
Potassium: 4.1 mmol/L (ref 3.5–5.1)
Sodium: 133 mmol/L — ABNORMAL LOW (ref 135–145)

## 2022-06-26 LAB — CBC
HCT: 44.8 % (ref 36.0–46.0)
Hemoglobin: 15.3 g/dL — ABNORMAL HIGH (ref 12.0–15.0)
MCH: 30.7 pg (ref 26.0–34.0)
MCHC: 34.2 g/dL (ref 30.0–36.0)
MCV: 90 fL (ref 80.0–100.0)
Platelets: 202 10*3/uL (ref 150–400)
RBC: 4.98 MIL/uL (ref 3.87–5.11)
RDW: 12.5 % (ref 11.5–15.5)
WBC: 7.3 10*3/uL (ref 4.0–10.5)
nRBC: 0 % (ref 0.0–0.2)

## 2022-06-26 LAB — GLUCOSE, CAPILLARY
Glucose-Capillary: 302 mg/dL — ABNORMAL HIGH (ref 70–99)
Glucose-Capillary: 350 mg/dL — ABNORMAL HIGH (ref 70–99)

## 2022-06-26 LAB — SURGICAL PCR SCREEN
MRSA, PCR: NEGATIVE
Staphylococcus aureus: NEGATIVE

## 2022-06-26 MED ORDER — SODIUM CHLORIDE 0.9 % IV SOLN: 0.3000 ug/kg | INTRAVENOUS | Status: AC

## 2022-06-26 NOTE — Progress Notes (Signed)
[employer] sent to Dr. Dwain Sarna with pt's A1C result of 9.0

## 2022-06-26 NOTE — Progress Notes (Signed)
PCP - Julian Reil Bucio FNP Cardiologist - none Endocrinologist -  PPM/ICD -  Device Orders -  Rep Notified -   Chest x-ray - none EKG - 06/26/22 Stress Test - none ECHO - none Cardiac Cath - none  Sleep Study - none CPAP - no  Fasting Blood Sugar - 200-250 Checks Blood Sugar five times a day  Blood Thinner Instructions:na Aspirin Instructions:na  ERAS Protcol -clear liquids until 0530 PRE-SURGERY Ensure or G2- G2  COVID TEST-na   Anesthesia review: yes -Von Willenbrands disease;A1c 9.0  Patient denies shortness of breath, fever, cough and chest pain at PAT appointment   All instructions explained to the patient, with a verbal understanding of the material. Patient agrees to go over the instructions while at home for a better understanding. Patient also instructed to self quarantine after being tested for COVID-19. The opportunity to ask questions was provided.

## 2022-06-27 NOTE — Progress Notes (Addendum)
Anesthesia Chart Review:  Case: [id number] Date/Time: 07/05/22 0815   Procedure: LEFT BREAST MASS EXCISIONAL BIOPSY (Left: Breast)   Anesthesia type: General   Pre-op diagnosis: LEFT BREAST MASS   Location: [facility] ROOM 01 / [facility] OR   Surgeons: Emelia Loron, MD       DISCUSSION: Patient is a 26 year old female scheduled for the above procedure. She has had an enlarging left breat mass, measuring 6.5 cm by 12/20/21 [place]. 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 Wanted to rule out phyllodes tumor.   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  Last hematology visit was on 02/08/22 with Freddi Starr, PA-C. Patient was initially seen by [facility] in 10/2017 prior to left heel I&D. She reported prior diagnosis of type I VWD responsive to DDAVP in the past (before T&A). At that time, she had had a lapse in hematology follow-up due to lack of insurance coverage. DDAVP recommended 30 minutes prior to her left foot surgeries. Freddi Starr did note that patient has VWB type 1 by history with "VWD activity/antigen/FVIII have been normal levels since we have been involved in her care.Marland KitchenMarland KitchenAnother possibility as to a contributor of bleeding is hyperglycemia associated platelet dysfunction which could be additive with mild t1 vwd." She uses Lysteda (tranexamic acid) for menorrhagia. Given VWB levels, hematology did not recommend peri-procedure infusions prior to 01/10/22 left breast biopsy. Per hematology records: Had levels in 2018: VWF ant 120%, FVIII 178, Act: 98 a with levels on 01/2022 of: VIII 89, Anti 101, Act 130.  Dr.  Dwain Sarna did reach out to [facility] for perioperative recommendations given history of VWB type 1 disease.  Per recommendations by Freddi Starr, PA-C on 06/19/22, "Discussed case with Dr Joseph Art  We would recommend IV DDAVP 0.3 mg/kg = 15 mg, given 30-60 min pre operatively. DDAVP tends to peak in few hours post infusion." She added on 06/22/22: "Plan:  Preop check of glucose 0.3 mg/kg of IV DDAVP - 16 mcg over 25 min , infuse 30-60 min prior to start of procedure Postop check of glucose, Gentle hydration given potential for hyponatremia. PRN sodium check. Patient to call [phone] for non emergency issues".   PAT labs showed a non-fasting serum glucose of 345 and A1c 9.0%. Her last visit with [facility] with Chana Bode, PA-C was on 03/29/22. A1c 8.8% then. She had only 3 days of CBG date available for review with "High variability with both episodes of hyper and hypoglycemia. No identifiable patterns." She wrote : "-Continue Tresiba 15 units nightly - Continue Humalog ICR 1: 7, we can ISF to 1: 50 greater than 130 mg/dL. --Reinforced the importance of utilizing lower glycemic target for correction.  -Discussed use of CGM. Respect patient decision to continue with fingerstick blood glucose monitoring. Patient encouraged to monitor 4-6 times per day."  She reported home fasting CBGs run ~ 200-250. PAT RN messaged Dr. Dwain Sarna A1c results. She will get a CBG on arrival for surgery.   Anesthesia team to evaluate on the day of surgery. Will reach out to Dr. Dwain Sarna regarding need for IV DDAVP order entry.   VS: BP 105/74   Pulse 97   Temp 36.9 C (Oral)  Resp 20   Ht 5\' 3"  (1.6 m)   Wt 53 kg   LMP 05/31/2022 (Approximate)   SpO2 100%   BMI 20.71 kg/m    PROVIDERS: Bucio, 08/01/2022, FNP is PCP  Julian Reil, MD is hematologist ([facility]) Donia Guiles, PA-C is endocrinology provider ([facility])   LABS: Preoperative labs noted. See DISCUSSION.   (all labs ordered  are listed, but only abnormal results are displayed)  Labs Reviewed  GLUCOSE, CAPILLARY - Abnormal; Notable for the following components:      Result Value   Glucose-Capillary 302 (*)    All other components within normal limits  HEMOGLOBIN A1C - Abnormal; Notable for the following components:   Hgb A1c MFr Bld 9.0 (*)    All other components within normal limits  BASIC METABOLIC PANEL - Abnormal; Notable for the following components:   Sodium 133 (*)    Glucose, Bld 345 (*)    All other components within normal limits  CBC - Abnormal; Notable for the following components:   Hemoglobin 15.3 (*)    All other components within normal limits  GLUCOSE, CAPILLARY - Abnormal; Notable for the following components:   Glucose-Capillary 350 (*)    All other components within normal limits  SURGICAL PCR SCREEN     IMAGES: Chana Bode Left Breast 12/20/21: IMPRESSION: Indeterminate 6.5 cm palpable mass in the left breast demonstrating 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   RECOMMENDATION: Recommend ultrasound-guided core biopsy of the mass in the left breast at the 12 o'clock position.   EKG: 06/26/22: NSR   CV: N/A  Past Medical History:  Diagnosis Date   Hypothyroidism    Type 1 diabetes (HCC)    Von Willebrand disease, type I (HCC)     Past Surgical History:  Procedure Laterality Date   INCISION AND DRAINAGE FOOT Left    TONSILLECTOMY      MEDICATIONS:  insulin aspart (NOVOLOG) 100 UNIT/ML injection   insulin glargine (LANTUS) 100 UNIT/ML injection   magnesium oxide (MAG-OX) 400 (241.3 Mg) MG tablet   ondansetron (ZOFRAN) 4 MG tablet   tranexamic acid (LYSTEDA) 650 MG TABS tablet   No current facility-administered medications for this encounter.    08/26/22, PA-C Surgical Short Stay/Anesthesiology [facility] Phone [phone] [facility] Phone [phone] 06/27/2022 3:38  PM

## 2022-06-27 NOTE — Anesthesia Preprocedure Evaluation (Addendum)
Anesthesia Evaluation  Patient identified by MRN, date of birth, ID band Patient awake    Reviewed: Allergy & Precautions, NPO status , Patient's Chart, lab work & pertinent test results  History of Anesthesia Complications Negative for: history of anesthetic complications  Airway Mallampati: I  TM Distance: >3 FB Neck ROM: Full    Dental  (+) Teeth Intact, Dental Advisory Given   Pulmonary former smoker,    breath sounds clear to auscultation       Cardiovascular negative cardio ROS   Rhythm:Regular     Neuro/Psych negative neurological ROS  negative psych ROS   GI/Hepatic negative GI ROS, Neg liver ROS,   Endo/Other  diabetes, Insulin DependentHypothyroidism   Renal/GU negative Renal ROS     Musculoskeletal negative musculoskeletal ROS (+)   Abdominal   Peds  Hematology  (+) Blood dyscrasia, , Von Willebrand disease, type I (HCC)   Lab Results      Component                Value               Date                      WBC                      7.3                 06/26/2022                HGB                      15.3 (H)            06/26/2022                HCT                      44.8                06/26/2022                MCV                      90.0                06/26/2022                PLT                      202                 06/26/2022              Anesthesia Other Findings DDAVP in preop  Reproductive/Obstetrics negative OB ROS Lab Results      Component                Value               Date                      PREGTESTUR               NEGATIVE            07/05/2022                PREGSERUM  NEGATIVE            02/27/2020                HCG                      <5.0                03/14/2022                                       Anesthesia Physical Anesthesia Plan  ASA: 2  Anesthesia Plan: General   Post-op Pain Management: Tylenol PO  (pre-op)*   Induction: Intravenous  PONV Risk Score and Plan: 3 and Ondansetron, Dexamethasone, Propofol infusion, TIVA and Midazolam  Airway Management Planned: LMA  Additional Equipment: None  Intra-op Plan:   Post-operative Plan: Extubation in OR  Informed Consent: I have reviewed the patients History and Physical, chart, labs and discussed the procedure including the risks, benefits and alternatives for the proposed anesthesia with the patient or authorized representative who has indicated his/her understanding and acceptance.     Dental advisory given  Plan Discussed with: CRNA  Anesthesia Plan Comments: (See PAT note written 06/27/2022 by Shonna Chock, PA-C. Dr. Dwain Sarna did reach out to [facility] for perioperative recommendations given history of VWB type 1 disease.   Per recommendations by Freddi Starr, PA-C on 06/19/22, "Discussed case with Dr Joseph Art  We would recommend IV DDAVP 0.3 mg/kg = 15 mg, given 30-60 min pre operatively. DDAVP tends to peak in few hours post infusion." She added on 06/22/22: "Plan:  Preop check of glucose 0.3 mg/kg of IV DDAVP - 16 mcg over 25 min , infuse 30-60 min prior to start of procedure Postop check of glucose, Gentle hydration given potential for hyponatremia. PRN sodium check. Patient to call [phone] for non emergency issues".)       Anesthesia Quick Evaluation

## 2022-07-04 ENCOUNTER — Other Ambulatory Visit: Payer: Self-pay | Admitting: General Surgery

## 2022-07-04 MED ORDER — SODIUM CHLORIDE 0.9 % IV SOLN: 0.3000 ug/kg | INTRAVENOUS | Status: AC

## 2022-07-04 NOTE — H&P (Signed)
26 year old female who has type 1 diabetes as well as type III von Willebrand's disease. She noted a left breast mass about 2 years ago. She states that this area has gotten larger. She has no nipple discharge. She has undergone evaluation of this with ultrasound. 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 She was seen at an outside side facility and due to the von Willebrand's disease was referred here. She is also seen plastic surgery for the possible need at the time of operation as well. She does not smoke or marijuana several times per week. Her last hemoglobin A1c she tells me is 8. She uses TXA for her heavy periods and for surgery before has used DDAVP and done well. Her hematologist is Dr. Donia Guiles at [facility].  Review of Systems: A complete review of systems was obtained from the patient. I have reviewed this information and discussed as appropriate with the patient. See HPI as well for other ROS.  Review of Systems  All other systems reviewed and are negative.   Medical History: Past Medical History:  Diagnosis Date  Diabetes mellitus without complication (CMS-HCC)  Hypothyroidism  Von Willebrand disease (CMS-HCC)   Patient Active Problem List  Diagnosis  Breast mass in female  Diabetic ketoacidosis without coma associated with type 1 diabetes mellitus (CMS-HCC)  Von Willebrand's disease (CMS-HCC)   Past Surgical History:  Procedure Laterality Date  TONSILLECTOMY   No Known Allergies  Current Outpatient Medications on File Prior to Visit  Medication Sig Dispense Refill  desmopressin (DDAVP) 0.2 MG tablet Take 0.2 mg by mouth at bedtime  insulin GLARGINE (LANTUS) injection (concentration 100 units/mL) 10 units  insulin LISPRO (ADMELOG, HUMALOG) injection (concentration 100 units/mL) as directed: BG value - 130 divided by 35  tranexamic acid (LYSTEDA) 650 mg tablet  Take by mouth    History reviewed. No pertinent family history.   Social History   Tobacco Use  Smoking Status Former  Types: Cigarettes  Quit date: 2019  Years since quitting: 4.4  Smokeless Tobacco Never    Social History   Socioeconomic History  Marital status: Unknown  Tobacco Use  Smoking status: Former  Types: Cigarettes  Quit date: 2019  Years since quitting: 4.4  Smokeless tobacco: Never  Vaping Use  Vaping Use: Never used  Substance and Sexual Activity  Alcohol use: Yes  Drug use: Yes  Types: Marijuana   Objective:   Vitals:  Body mass index is 20.69 kg/m.  Physical Exam Constitutional:  Appearance: Normal appearance.  Chest:  Breasts: Right: No inverted nipple, mass or nipple discharge.  Left: Mass present. No inverted nipple or nipple discharge.  Comments: 6 cm mobile mass central to upper pole of left breast, mobile nontender Lymphadenopathy:  Upper Body:  Right upper body: No supraclavicular or axillary adenopathy.  Left upper body: No supraclavicular adenopathy.  Neurological:  Mental Status: She is alert.    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 I think she needs an excisional biopsy of this mass. We discussed at length the options for this. My preference would be to go do an excisional biopsy via a periareolar incision possibility with an extension to get this out. I think that the breast tissue could likely be put back together and give her a reasonable  result. I do not think that she needs any flap closure at this point in time. We did discuss if she is not satisfied with the result this is some that she certainly could proceed with in the long-term. I absolutely do not think she needs a mastectomy for this at all either. I am going to obtain some recommendations from her hematologist for surgery and they will plan to do this later in the summer after they  have a family vacation. We discussed the risks which include recurrence, bleeding, infection, possible need for symmetry procedure at a later date as well.

## 2022-07-05 ENCOUNTER — Ambulatory Visit (HOSPITAL_COMMUNITY): Payer: 59 | Admitting: Vascular Surgery

## 2022-07-05 ENCOUNTER — Other Ambulatory Visit: Payer: Self-pay

## 2022-07-05 ENCOUNTER — Encounter (HOSPITAL_COMMUNITY): Payer: Self-pay | Admitting: General Surgery

## 2022-07-05 ENCOUNTER — Ambulatory Visit (HOSPITAL_BASED_OUTPATIENT_CLINIC_OR_DEPARTMENT_OTHER): Payer: 59 | Admitting: Vascular Surgery

## 2022-07-05 ENCOUNTER — Encounter (HOSPITAL_COMMUNITY)
Admission: RE | Disposition: A | Discharge status: Home / Self Care | Payer: Self-pay | Source: Ambulatory Visit | Attending: General Surgery

## 2022-07-05 ENCOUNTER — Ambulatory Visit (HOSPITAL_COMMUNITY)
Admission: RE | Admit: 2022-07-05 | Discharge: 2022-07-05 | Disposition: A | Discharge status: Home / Self Care | Payer: 59 | Source: Ambulatory Visit | Attending: General Surgery | Admitting: General Surgery

## 2022-07-05 DIAGNOSIS — N6320 Unspecified lump in the left breast, unspecified quadrant: Secondary | ICD-10-CM | POA: Diagnosis not present

## 2022-07-05 DIAGNOSIS — F1290 Cannabis use, unspecified, uncomplicated: Secondary | ICD-10-CM | POA: Diagnosis not present

## 2022-07-05 DIAGNOSIS — N6012 Diffuse cystic mastopathy of left breast: Secondary | ICD-10-CM | POA: Diagnosis not present

## 2022-07-05 DIAGNOSIS — Z794 Long term (current) use of insulin: Secondary | ICD-10-CM | POA: Diagnosis not present

## 2022-07-05 DIAGNOSIS — N6042 Mammary duct ectasia of left breast: Secondary | ICD-10-CM | POA: Diagnosis not present

## 2022-07-05 DIAGNOSIS — Z87891 Personal history of nicotine dependence: Secondary | ICD-10-CM | POA: Diagnosis not present

## 2022-07-05 DIAGNOSIS — Z01818 Encounter for other preprocedural examination: Secondary | ICD-10-CM

## 2022-07-05 DIAGNOSIS — E039 Hypothyroidism, unspecified: Secondary | ICD-10-CM | POA: Insufficient documentation

## 2022-07-05 DIAGNOSIS — N6082 Other benign mammary dysplasias of left breast: Secondary | ICD-10-CM | POA: Insufficient documentation

## 2022-07-05 DIAGNOSIS — N6489 Other specified disorders of breast: Secondary | ICD-10-CM | POA: Diagnosis not present

## 2022-07-05 DIAGNOSIS — D6801 Von willebrand disease, type 1: Secondary | ICD-10-CM | POA: Diagnosis not present

## 2022-07-05 DIAGNOSIS — E109 Type 1 diabetes mellitus without complications: Secondary | ICD-10-CM | POA: Insufficient documentation

## 2022-07-05 DIAGNOSIS — D242 Benign neoplasm of left breast: Secondary | ICD-10-CM | POA: Diagnosis not present

## 2022-07-05 HISTORY — PX: BREAST CYST EXCISION: SHX579

## 2022-07-05 LAB — GLUCOSE, CAPILLARY
Glucose-Capillary: 237 mg/dL — ABNORMAL HIGH (ref 70–99)
Glucose-Capillary: 276 mg/dL — ABNORMAL HIGH (ref 70–99)
Glucose-Capillary: 272 mg/dL — ABNORMAL HIGH (ref 70–99)

## 2022-07-05 LAB — POCT PREGNANCY, URINE: Preg Test, Ur: NEGATIVE

## 2022-07-05 SURGERY — EXCISION, CYST, BREAST
Anesthesia: General | Site: Breast | Laterality: Left

## 2022-07-05 MED ORDER — ONDANSETRON HCL 4 MG/2ML IJ SOLN
INTRAMUSCULAR | Status: AC
  Filled 2022-07-05: qty 2

## 2022-07-05 MED ORDER — FENTANYL CITRATE (PF) 250 MCG/5ML IJ SOLN
INTRAMUSCULAR | Status: DC | PRN
  Administered 2022-07-05 (×3): 50 ug via INTRAVENOUS

## 2022-07-05 MED ORDER — OXYCODONE HCL 5 MG PO TABS: 5.0000 mg | ORAL_TABLET | Freq: Once | ORAL | Status: DC | PRN

## 2022-07-05 MED ORDER — INSULIN ASPART 100 UNIT/ML IJ SOLN
0.0000 [IU] | INTRAMUSCULAR | Status: DC | PRN
  Filled 2022-07-05: qty 1

## 2022-07-05 MED ORDER — TRAMADOL HCL 50 MG PO TABS: 100.0000 mg | ORAL_TABLET | Freq: Four times a day (QID) | ORAL | 0 refills | Status: DC | PRN

## 2022-07-05 MED ORDER — LACTATED RINGERS IV SOLN: INTRAVENOUS | Status: DC

## 2022-07-05 MED ORDER — SODIUM CHLORIDE 0.9% FLUSH: 3.0000 mL | INTRAVENOUS | Status: DC | PRN

## 2022-07-05 MED ORDER — OXYCODONE HCL 5 MG/5ML PO SOLN: 5.0000 mg | Freq: Once | ORAL | Status: DC | PRN

## 2022-07-05 MED ORDER — MIDAZOLAM HCL 2 MG/2ML IJ SOLN
INTRAMUSCULAR | Status: DC | PRN
  Administered 2022-07-05: 2 mg via INTRAVENOUS

## 2022-07-05 MED ORDER — DEXAMETHASONE SODIUM PHOSPHATE 10 MG/ML IJ SOLN
INTRAMUSCULAR | Status: DC | PRN
  Administered 2022-07-05: 4 mg via INTRAVENOUS

## 2022-07-05 MED ORDER — ACETAMINOPHEN 500 MG PO TABS
1000.0000 mg | ORAL_TABLET | ORAL | Status: AC
  Administered 2022-07-05: 1000 mg via ORAL
  Filled 2022-07-05: qty 2

## 2022-07-05 MED ORDER — CEFAZOLIN SODIUM-DEXTROSE 2-4 GM/100ML-% IV SOLN
2.0000 g | INTRAVENOUS | Status: AC
  Administered 2022-07-05: 2 g via INTRAVENOUS
  Filled 2022-07-05: qty 100

## 2022-07-05 MED ORDER — INSULIN ASPART 100 UNIT/ML IJ SOLN
3.0000 [IU] | Freq: Once | INTRAMUSCULAR | Status: AC
  Administered 2022-07-05: 3 [IU] via SUBCUTANEOUS

## 2022-07-05 MED ORDER — DEXAMETHASONE SODIUM PHOSPHATE 10 MG/ML IJ SOLN
INTRAMUSCULAR | Status: AC
  Filled 2022-07-05: qty 1

## 2022-07-05 MED ORDER — CHLORHEXIDINE GLUCONATE 0.12 % MT SOLN
15.0000 mL | Freq: Once | OROMUCOSAL | Status: AC
  Administered 2022-07-05: 15 mL via OROMUCOSAL
  Filled 2022-07-05: qty 15

## 2022-07-05 MED ORDER — ORAL CARE MOUTH RINSE: 15.0000 mL | Freq: Once | OROMUCOSAL | Status: AC

## 2022-07-05 MED ORDER — LIDOCAINE 2% (20 MG/ML) 5 ML SYRINGE
INTRAMUSCULAR | Status: DC | PRN
  Administered 2022-07-05: 60 mg via INTRAVENOUS

## 2022-07-05 MED ORDER — ACETAMINOPHEN 325 MG PO TABS: 650.0000 mg | ORAL_TABLET | ORAL | Status: DC | PRN

## 2022-07-05 MED ORDER — SODIUM CHLORIDE 0.9% FLUSH
3.0000 mL | Freq: Two times a day (BID) | INTRAVENOUS | Status: DC
Start: 2022-07-05 — End: 2022-07-05

## 2022-07-05 MED ORDER — LIDOCAINE 2% (20 MG/ML) 5 ML SYRINGE
INTRAMUSCULAR | Status: AC
  Filled 2022-07-05: qty 5

## 2022-07-05 MED ORDER — PROPOFOL 10 MG/ML IV BOLUS
INTRAVENOUS | Status: AC
  Filled 2022-07-05: qty 20

## 2022-07-05 MED ORDER — BUPIVACAINE-EPINEPHRINE (PF) 0.25% -1:200000 IJ SOLN
INTRAMUSCULAR | Status: AC
  Filled 2022-07-05: qty 30

## 2022-07-05 MED ORDER — 0.9 % SODIUM CHLORIDE (POUR BTL) OPTIME
TOPICAL | Status: DC | PRN
  Administered 2022-07-05: 1000 mL

## 2022-07-05 MED ORDER — HEMOSTATIC AGENTS (NO CHARGE) OPTIME
TOPICAL | Status: DC | PRN
  Administered 2022-07-05: 1 via TOPICAL

## 2022-07-05 MED ORDER — INSULIN ASPART 100 UNIT/ML IJ SOLN
2.0000 [IU] | Freq: Once | INTRAMUSCULAR | Status: AC
Start: 2022-07-05 — End: 2022-07-05
  Administered 2022-07-05: 2 [IU] via SUBCUTANEOUS

## 2022-07-05 MED ORDER — ONDANSETRON HCL 4 MG/2ML IJ SOLN
INTRAMUSCULAR | Status: DC | PRN
  Administered 2022-07-05: 4 mg via INTRAVENOUS

## 2022-07-05 MED ORDER — CHLORHEXIDINE GLUCONATE CLOTH 2 % EX PADS: 6.0000 | MEDICATED_PAD | Freq: Once | CUTANEOUS | Status: DC

## 2022-07-05 MED ORDER — FENTANYL CITRATE (PF) 250 MCG/5ML IJ SOLN
INTRAMUSCULAR | Status: AC
  Filled 2022-07-05: qty 5

## 2022-07-05 MED ORDER — FENTANYL CITRATE (PF) 100 MCG/2ML IJ SOLN: 25.0000 ug | INTRAMUSCULAR | Status: DC | PRN

## 2022-07-05 MED ORDER — SODIUM CHLORIDE 0.9 % IV SOLN: 250.0000 mL | INTRAVENOUS | Status: DC | PRN

## 2022-07-05 MED ORDER — DEXMEDETOMIDINE (PRECEDEX) IN NS 20 MCG/5ML (4 MCG/ML) IV SYRINGE
PREFILLED_SYRINGE | INTRAVENOUS | Status: DC | PRN
  Administered 2022-07-05 (×3): 4 ug via INTRAVENOUS

## 2022-07-05 MED ORDER — BUPIVACAINE-EPINEPHRINE 0.25% -1:200000 IJ SOLN
INTRAMUSCULAR | Status: DC | PRN
  Administered 2022-07-05: 10 mL

## 2022-07-05 MED ORDER — MIDAZOLAM HCL 2 MG/2ML IJ SOLN
INTRAMUSCULAR | Status: AC
  Filled 2022-07-05: qty 2

## 2022-07-05 MED ORDER — ACETAMINOPHEN 650 MG RE SUPP: 650.0000 mg | RECTAL | Status: DC | PRN

## 2022-07-05 MED ORDER — PROPOFOL 500 MG/50ML IV EMUL
INTRAVENOUS | Status: DC | PRN
  Administered 2022-07-05: 200 ug/kg/min via INTRAVENOUS

## 2022-07-05 MED ORDER — DESMOPRESSIN ACETATE 4 MCG/ML IJ SOLN
0.3000 ug/kg | INTRAMUSCULAR | Status: AC
Start: 2022-07-05 — End: 2022-07-05
  Administered 2022-07-05: 16 ug via INTRAVENOUS
  Filled 2022-07-05: qty 4

## 2022-07-05 MED ORDER — PROPOFOL 10 MG/ML IV BOLUS
INTRAVENOUS | Status: DC | PRN
  Administered 2022-07-05: 180 mg via INTRAVENOUS
  Administered 2022-07-05: 20 mg via INTRAVENOUS
  Administered 2022-07-05: 50 mg via INTRAVENOUS

## 2022-07-05 MED ORDER — DEXMEDETOMIDINE HCL IN NACL 80 MCG/20ML IV SOLN
INTRAVENOUS | Status: AC
  Filled 2022-07-05: qty 20

## 2022-07-05 MED ORDER — OXYCODONE HCL 5 MG PO TABS: 5.0000 mg | ORAL_TABLET | ORAL | Status: DC | PRN

## 2022-07-05 MED ORDER — ENSURE PRE-SURGERY PO LIQD: 296.0000 mL | Freq: Once | ORAL | Status: DC

## 2022-07-05 SURGICAL SUPPLY — 44 items
ADH SKN CLS APL DERMABOND .7 (GAUZE/BANDAGES/DRESSINGS) ×1
APL PRP STRL LF DISP 70% ISPRP (MISCELLANEOUS) ×1
APPLIER CLIP 9.375 MED OPEN (MISCELLANEOUS)
APR CLP MED 9.3 20 MLT OPN (MISCELLANEOUS)
BAG COUNTER SPONGE SURGICOUNT (BAG) ×2 IMPLANT
BAG SPNG CNTER NS LX DISP (BAG) ×1
BINDER BREAST LRG (GAUZE/BANDAGES/DRESSINGS) ×1 IMPLANT
BINDER BREAST XLRG (GAUZE/BANDAGES/DRESSINGS) IMPLANT
CANISTER SUCT 3000ML PPV (MISCELLANEOUS) ×2 IMPLANT
CHLORAPREP W/TINT 26 (MISCELLANEOUS) ×2 IMPLANT
CLIP APPLIE 9.375 MED OPEN (MISCELLANEOUS) IMPLANT
CLOSURE STERI STRIP 1/2 X4 (GAUZE/BANDAGES/DRESSINGS) ×1 IMPLANT
COVER SURGICAL LIGHT HANDLE (MISCELLANEOUS) ×2 IMPLANT
DERMABOND ADVANCED (GAUZE/BANDAGES/DRESSINGS) ×1
DERMABOND ADVANCED .7 DNX12 (GAUZE/BANDAGES/DRESSINGS) ×1 IMPLANT
DRAPE CHEST BREAST 15X10 FENES (DRAPES) ×2 IMPLANT
ELECT CAUTERY BLADE 6.4 (BLADE) ×2 IMPLANT
ELECT REM PT RETURN 9FT ADLT (ELECTROSURGICAL) ×2
ELECTRODE REM PT RTRN 9FT ADLT (ELECTROSURGICAL) ×1 IMPLANT
GLOVE BIO SURGEON STRL SZ7 (GLOVE) ×2 IMPLANT
GLOVE BIOGEL PI IND STRL 7.5 (GLOVE) ×1 IMPLANT
GLOVE BIOGEL PI INDICATOR 7.5 (GLOVE) ×1
GOWN STRL REUS W/ TWL LRG LVL3 (GOWN DISPOSABLE) ×2 IMPLANT
GOWN STRL REUS W/TWL LRG LVL3 (GOWN DISPOSABLE) ×4
HEMOSTAT ARISTA ABSORB 3G PWDR (HEMOSTASIS) ×1 IMPLANT
KIT BASIN OR (CUSTOM PROCEDURE TRAY) ×2 IMPLANT
KIT TURNOVER KIT B (KITS) ×2 IMPLANT
NDL HYPO 25GX1X1/2 BEV (NEEDLE) ×1 IMPLANT
NEEDLE HYPO 25GX1X1/2 BEV (NEEDLE) ×2 IMPLANT
NS IRRIG 1000ML POUR BTL (IV SOLUTION) ×2 IMPLANT
PACK GENERAL/GYN (CUSTOM PROCEDURE TRAY) ×2 IMPLANT
PAD ARMBOARD 7.5X6 YLW CONV (MISCELLANEOUS) ×2 IMPLANT
PENCIL SMOKE EVACUATOR (MISCELLANEOUS) IMPLANT
SPIKE FLUID TRANSFER (MISCELLANEOUS) ×2 IMPLANT
SUT MNCRL AB 4-0 PS2 18 (SUTURE) IMPLANT
SUT MON AB 5-0 PS2 18 (SUTURE) ×1 IMPLANT
SUT SILK 2 0 SH (SUTURE) ×1 IMPLANT
SUT VIC AB 2-0 SH 27 (SUTURE) ×2
SUT VIC AB 2-0 SH 27XBRD (SUTURE) ×1 IMPLANT
SUT VIC AB 3-0 SH 27 (SUTURE) ×2
SUT VIC AB 3-0 SH 27X BRD (SUTURE) ×1 IMPLANT
SYR CONTROL 10ML LL (SYRINGE) ×2 IMPLANT
TOWEL GREEN STERILE (TOWEL DISPOSABLE) ×2 IMPLANT
TOWEL GREEN STERILE FF (TOWEL DISPOSABLE) ×2 IMPLANT

## 2022-07-05 NOTE — Anesthesia Procedure Notes (Signed)
Procedure Name: LMA Insertion Date/Time: 07/05/2022 8:37 AM  Performed by: Zollie Beckers, CRNAPre-anesthesia Checklist: Patient identified, Emergency Drugs available, Suction available and Patient being monitored Patient Re-evaluated:Patient Re-evaluated prior to induction Oxygen Delivery Method: Circle System Utilized Preoxygenation: Pre-oxygenation with 100% oxygen Induction Type: IV induction Ventilation: Mask ventilation without difficulty LMA: LMA inserted LMA Size: 4.0 Number of attempts: 1 Airway Equipment and Method: Bite block Tube secured with: Tape Dental Injury: Teeth and Oropharynx as per pre-operative assessment

## 2022-07-05 NOTE — Interval H&P Note (Signed)
History and Physical Interval Note:  07/05/2022 7:50 AM  Tammy Higgins  has presented today for surgery, with the diagnosis of LEFT BREAST MASS.  The various methods of treatment have been discussed with the patient and family. After consideration of risks, benefits and other options for treatment, the patient has consented to  Procedure(s): LEFT BREAST MASS EXCISIONAL BIOPSY (Left) as a surgical intervention.  The patient's history has been reviewed, patient examined, no change in status, stable for surgery.  I have reviewed the patient's chart and labs.  Questions were answered to the patient's satisfaction.     Tammy Higgins

## 2022-07-05 NOTE — Discharge Instructions (Signed)
[facility] Office Phone Number [phone]  POST OP INSTRUCTIONS Take 400 mg of ibuprofen every 8 hours or 650 mg tylenol every 6 hours for next 72 hours then as needed. Use ice several times daily also.  A prescription for pain medication may be given to you upon discharge.  Take your pain medication as prescribed, if needed.  If narcotic pain medicine is not needed, then you may take acetaminophen (Tylenol), naprosyn (Alleve) or ibuprofen (Advil) as needed. Take your usually prescribed medications unless otherwise directed If you need a refill on your pain medication, please contact your pharmacy.  They will contact our office to request authorization.  Prescriptions will not be filled after 5pm or on week-ends. You should eat very light the first 24 hours after surgery, such as soup, crackers, pudding, etc.  Resume your normal diet the day after surgery. Most patients will experience some swelling and bruising in the breast.  Ice packs and a good support bra will help.  Wear the breast binder provided or a sports bra for 72 hours day and night.  After that wear a sports bra during the day until you return to the office. Swelling and bruising can take several days to resolve.  It is common to experience some constipation if taking pain medication after surgery.  Increasing fluid intake and taking a stool softener will usually help or prevent this problem from occurring.  A mild laxative (Milk of Magnesia or Miralax) should be taken according to package directions if there are no bowel movements after 48 hours. I used skin glue on the incision, you may shower in 24 hours.  The glue will flake off over the next 2-3 weeks.  Any sutures or staples will be removed at the office during your follow-up visit. ACTIVITIES:  You may resume regular daily activities (gradually increasing) beginning the next day.  Wearing a good support bra or sports bra minimizes pain and swelling.  You may have  sexual intercourse when it is comfortable. You may drive when you no longer are taking prescription pain medication, you can comfortably wear a seatbelt, and you can safely maneuver your car and apply brakes. RETURN TO WORK:  ______________________________________________________________________________________ You should see your doctor in the office for a follow-up appointment approximately two weeks after your surgery.  Your doctor's nurse will typically make your follow-up appointment when she calls you with your pathology report.  Expect your pathology report 3-4 business days after your surgery.  You may call to check if you do not hear from us after three days. OTHER INSTRUCTIONS: _______________________________________________________________________________________________ _____________________________________________________________________________________________________________________________________ _____________________________________________________________________________________________________________________________________ _____________________________________________________________________________________________________________________________________  WHEN TO CALL DR Elige Shouse: Fever over 101.0 Nausea and/or vomiting. Extreme swelling or bruising. Continued bleeding from incision. Increased pain, redness, or drainage from the incision.  The clinic staff is available to answer your questions during regular business hours.  Please don't hesitate to call and ask to speak to one of the nurses for clinical concerns.  If you have a medical emergency, go to the nearest emergency room or call 911.  A surgeon from [facility] is always on call at the hospital.  For further questions, please visit [email or web address]  

## 2022-07-05 NOTE — Transfer of Care (Signed)
Immediate Anesthesia Transfer of Care Note  Patient: Tammy Higgins  Procedure(s) Performed: LEFT BREAST MASS EXCISIONAL BIOPSY (Left: Breast)  Patient Location: PACU  Anesthesia Type:General  Level of Consciousness: awake  Airway & Oxygen Therapy: Patient Spontanous Breathing  Post-op Assessment: Report given to RN and Post -op Vital signs reviewed and stable  Post vital signs: Reviewed and stable  Last Vitals:  Vitals Value Taken Time  BP 110/69 07/05/22 0930  Temp    Pulse 95 07/05/22 0931  Resp 13 07/05/22 0931  SpO2 98 % 07/05/22 0931  Vitals shown include unvalidated device data.  Last Pain:  Vitals:   07/05/22 0727  TempSrc:   PainSc: 0-No pain         Complications: No notable events documented.

## 2022-07-05 NOTE — Op Note (Signed)
Preoperative diagnosis: Left breast mass Postoperative diagnosis: Same as above Procedure: Left breast mass excisional biopsy Surgeon: Dr. Harden Mo Anesthesia: General Estimated blood loss: Minimal Complications: None Specimens: Left breast mass marked short superior, long lateral, double deep Complications: None Drains: None Sponge needle count was correct completion Disposition to recovery stable condition  Indications: This is a 26 year old female with type 1 diabetes and von Willebrand's disease.  She has a left breast mass that has gotten significantly larger.  This is a 6.5 cm mass on ultrasound with features consistent with a fibroadenoma.  This underwent a biopsy that shows passion is read is discordant.  She has been discussed for excision.  She was recommended a mastectomy with reconstruction at an outside facility.  I saw her and this is certainly amenable to an excisional biopsy.  We discussed this and I got recommendations from hematologist for her von Willebrand's disease.  Procedure: After informed consent was obtained the patient was taken to the operating room.  She was administered 50 mg of DDAVP prior to beginning.  She was given antibiotics.  SCDs were in place.  She was placed under general esthesia without complication.  She was prepped and draped in the standard sterile surgical fashion.  Surgical timeout was then performed.  I have treated Marcaine throughout the central portion of the breast.  I then made a periareolar incision and dissected the mass.  This was not infiltrative and it was encapsulated.  Due to the large size I she had to extend this out both medially and laterally.  I was then able to free the mass from the surrounding tissue and apply a 3-0 silk suture to it to be able to extract it from the incision.  This was then removed in total.  This was marked as above.  I then obtained hemostasis.  I did place some Arista in the cavity.  I then closed the  breast tissue down with 2-0 Vicryl.  The skin was closed with 3-0 Vicryl and 5-0 Monocryl.  Glue and Steri-Strips were applied.  She tolerated this well was extubated transferred to recovery stable.

## 2022-07-06 ENCOUNTER — Encounter (HOSPITAL_COMMUNITY): Payer: Self-pay | Admitting: General Surgery

## 2022-07-06 NOTE — Anesthesia Postprocedure Evaluation (Signed)
Anesthesia Post Note  Patient: Marguerita Stapp  Procedure(s) Performed: LEFT BREAST MASS EXCISIONAL BIOPSY (Left: Breast)     Patient location during evaluation: PACU Anesthesia Type: General Level of consciousness: awake and alert Pain management: pain level controlled Vital Signs Assessment: post-procedure vital signs reviewed and stable Respiratory status: spontaneous breathing, nonlabored ventilation, respiratory function stable and patient connected to nasal cannula oxygen Cardiovascular status: blood pressure returned to baseline and stable Postop Assessment: no apparent nausea or vomiting Anesthetic complications: no   No notable events documented.  Last Vitals:  Vitals:   07/05/22 0940 07/05/22 0955  BP: 110/68 111/73  Pulse: 92 91  Resp: 18 17  Temp:  36.7 C  SpO2: 99% 99%    Last Pain:  Vitals:   07/05/22 0955  TempSrc:   PainSc: 0-No pain                 Romani Wilbon

## 2022-07-20 LAB — SURGICAL PATHOLOGY

## 2022-12-11 ENCOUNTER — Emergency Department (HOSPITAL_COMMUNITY): Payer: 59

## 2022-12-11 ENCOUNTER — Encounter (HOSPITAL_COMMUNITY): Payer: Self-pay

## 2022-12-11 ENCOUNTER — Other Ambulatory Visit: Payer: Self-pay

## 2022-12-11 ENCOUNTER — Emergency Department (HOSPITAL_COMMUNITY)
Admission: EM | Admit: 2022-12-11 | Discharge: 2022-12-11 | Disposition: A | Discharge status: Home / Self Care | Payer: 59 | Attending: Emergency Medicine | Admitting: Emergency Medicine

## 2022-12-11 DIAGNOSIS — N644 Mastodynia: Secondary | ICD-10-CM | POA: Insufficient documentation

## 2022-12-11 DIAGNOSIS — R079 Chest pain, unspecified: Secondary | ICD-10-CM | POA: Diagnosis not present

## 2022-12-11 DIAGNOSIS — N6489 Other specified disorders of breast: Secondary | ICD-10-CM | POA: Diagnosis not present

## 2022-12-11 DIAGNOSIS — R0789 Other chest pain: Secondary | ICD-10-CM | POA: Diagnosis not present

## 2022-12-11 LAB — BASIC METABOLIC PANEL
Anion gap: 10 (ref 5–15)
BUN: 16 mg/dL (ref 6–20)
CO2: 21 mmol/L — ABNORMAL LOW (ref 22–32)
Calcium: 8.9 mg/dL (ref 8.9–10.3)
Chloride: 100 mmol/L (ref 98–111)
Creatinine, Ser: 0.81 mg/dL (ref 0.44–1.00)
GFR, Estimated: >60 mL/min (ref >60)
Glucose, Bld: 435 mg/dL — ABNORMAL HIGH (ref 70–99)
Potassium: 4.5 mmol/L (ref 3.5–5.1)
Sodium: 131 mmol/L — ABNORMAL LOW (ref 135–145)

## 2022-12-11 LAB — TROPONIN I (HIGH SENSITIVITY)
Troponin I (High Sensitivity): <2 ng/L (ref <18)
Troponin I (High Sensitivity): <2 ng/L (ref <18)

## 2022-12-11 LAB — CBC
HCT: 43 % (ref 36.0–46.0)
Hemoglobin: 15 g/dL (ref 12.0–15.0)
MCH: 31 pg (ref 26.0–34.0)
MCHC: 34.9 g/dL (ref 30.0–36.0)
MCV: 88.8 fL (ref 80.0–100.0)
Platelets: 200 10*3/uL (ref 150–400)
RBC: 4.84 MIL/uL (ref 3.87–5.11)
RDW: 12 % (ref 11.5–15.5)
WBC: 9 10*3/uL (ref 4.0–10.5)
nRBC: 0 % (ref 0.0–0.2)

## 2022-12-11 LAB — LACTIC ACID, PLASMA: Lactic Acid, Venous: 0.9 mmol/L (ref 0.5–1.9)

## 2022-12-11 MED ORDER — HYDROCODONE-ACETAMINOPHEN 5-325 MG PO TABS: 1.0000 | ORAL_TABLET | Freq: Once | ORAL | Status: DC

## 2022-12-11 MED ORDER — KETOROLAC TROMETHAMINE 15 MG/ML IJ SOLN: 15.0000 mg | Freq: Once | INTRAMUSCULAR | Status: DC

## 2022-12-11 MED ORDER — SODIUM CHLORIDE 0.9 % IV BOLUS: 1000.0000 mL | Freq: Once | INTRAVENOUS | Status: DC

## 2022-12-11 NOTE — ED Provider Notes (Signed)
[facility] Provider Note   CSN: [id number] Arrival date & time: 12/11/22  1120     History  Chief Complaint  Patient presents with   Chest Pain   Breast Pain    Tammy Higgins is a 27 y.o. female, history of left breast lumpectomy, who presents to the ED secondary to increased swelling/pain to the left breast, and chest wall for the last 3 to 4 days.  She states she has had some hardness in her breast after the lumpectomy in August, done by Dr. Donne Hazel, but that it was not painful after the surgery, and now it is painful.  She states that she feels like she cannot breathe because the pressure on her chest wall.  She denies any fever, chills, no nausea or vomiting.  She states she notified Dr. Cristal Generous office prior to coming here.  Also reports some crusting in the left nipple, with no discharge.  Denies any history of pregnancies.    Home Medications Prior to Admission medications   Medication Sig Start Date End Date Taking? Authorizing Provider  insulin aspart (NOVOLOG) 100 UNIT/ML injection Inject 1-7 Units into the skin 3 (three) times daily before meals. Blood sugar -130 and divided by 50 Correct if blood sugar is above 250    [provider]  insulin glargine (LANTUS) 100 UNIT/ML injection Inject 0.15 mLs (15 Units total) into the skin at bedtime. Patient taking differently: Inject 12 Units into the skin at bedtime. 12/07/20   Orson Eva, MD  magnesium oxide (MAG-OX) 400 (241.3 Mg) MG tablet Take 1 tablet (400 mg total) by mouth daily. Patient not taking: Reported on 06/19/2022 12/07/20   Orson Eva, MD  ondansetron (ZOFRAN) 4 MG tablet Take 1 tablet (4 mg total) by mouth every 6 (six) hours. Patient not taking: Reported on 06/19/2022 03/01/21   Orpah Greek, MD  traMADol (ULTRAM) 50 MG tablet Take 2 tablets (100 mg total) by mouth every 6 (six) hours as needed. 07/05/22   Rolm Bookbinder, MD  tranexamic acid  (LYSTEDA) 650 MG TABS tablet Take 1,300 mg by mouth See admin instructions. Take 1300 mg by mouth three times daily during menstrual cycle or during surgical procedures    [provider]      Allergies    Patient has no known allergies.    Review of Systems   Review of Systems  Respiratory:  Positive for shortness of breath.   Cardiovascular:  Positive for chest pain.    Physical Exam Updated Vital Signs BP 127/70 (BP Location: Right Arm)   Pulse (!) 104   Temp 98.7 F (37.1 C) (Oral)   Resp 16   Ht 5\' 3"  (1.6 m)   Wt 54.4 kg   LMP 11/20/2022   SpO2 99%   BMI 21.26 kg/m  Physical Exam Vitals and nursing note reviewed.  Constitutional:      General: She is not in acute distress.    Appearance: She is well-developed.  HENT:     Head: Normocephalic and atraumatic.  Eyes:     Conjunctiva/sclera: Conjunctivae normal.  Cardiovascular:     Rate and Rhythm: Normal rate and regular rhythm.     Heart sounds: No murmur heard. Pulmonary:     Effort: Pulmonary effort is normal. No respiratory distress.     Breath sounds: Normal breath sounds.  Chest:       Comments: Induration of the left upper quadrant of the breast, with some crusting  along the nipple.  No redness or fluctuance.  Tender to palpation. Abdominal:     Palpations: Abdomen is soft.     Tenderness: There is no abdominal tenderness.  Musculoskeletal:        General: No swelling.     Cervical back: Neck supple.  Skin:    General: Skin is warm and dry.     Capillary Refill: Capillary refill takes less than 2 seconds.  Neurological:     Mental Status: She is alert.  Psychiatric:        Mood and Affect: Mood normal.     ED Results / Procedures / Treatments   Labs (all labs ordered are listed, but only abnormal results are displayed) Labs Reviewed  BASIC METABOLIC PANEL - Abnormal; Notable for the following components:      Result Value   Sodium 131 (*)    CO2 21 (*)    Glucose, Bld 435 (*)     All other components within normal limits  CULTURE, BLOOD (ROUTINE X 2)  CULTURE, BLOOD (ROUTINE X 2)  CBC  LACTIC ACID, PLASMA  HCG, QUANTITATIVE, PREGNANCY  TROPONIN I (HIGH SENSITIVITY)  TROPONIN I (HIGH SENSITIVITY)    EKG None  Radiology DG Chest 1 View  Result Date: 12/11/2022 CLINICAL DATA:  Chest pain EXAM: CHEST  1 VIEW COMPARISON:  Chest x-ray dated December 05, 2020 FINDINGS: The heart size and mediastinal contours are within normal limits. Both lungs are clear. The visualized skeletal structures are unremarkable. IMPRESSION: No active disease. Electronically Signed   By: Yetta Glassman M.D.   On: 12/11/2022 14:31    Procedures Procedures    Medications Ordered in ED Medications  HYDROcodone-acetaminophen (NORCO/VICODIN) 5-325 MG per tablet 1 tablet (has no administration in time range)    ED Course/ Medical Decision Making/ A&P                             Medical Decision Making Patient is a 27 year old female, history of lumpectomy, who presents to the ED secondary to breast tenderness, swelling for the last 3 days, she states that her breast has been swollen since her lumpectomy, it feels very hard, but has become more painful last couple days.  We will obtain ultrasound of this area, pregnancy test, basic labs including CBC and BMP.  Lactic acid and culture ordered by triage, she has a complains of chest pain tenderness thus we will obtain a troponin.  Amount and/or Complexity of Data Reviewed Labs: ordered.    Details: Labs are fairly unremarkable no evidence of leukocytosis, she is hyperglycemic however. Radiology: ordered.    Details: Ultrasound results pending at time of elopement Discussion of management or test interpretation with external provider(s): Discussed with patient, labs are reassuring for not abscess, pending ultrasound.  Informed by charge nurse, patient requesting be discharged, I stated that she will need to wait for ultrasound results, and  at the time of this continued to evaluate patient again, she had eloped from stretcher.  She will need to follow-up with her primary care doctor for these results  Risk Prescription drug management.    Final Clinical Impression(s) / ED Diagnoses Final diagnoses:  Breast pain in female    Rx / DC Orders ED Discharge Orders     None         Diamantina Monks, Si Gaul, PA 12/11/22 1818    Fredia Sorrow, MD 12/13/22 1907

## 2022-12-11 NOTE — ED Provider Triage Note (Signed)
Emergency Medicine Provider Triage Evaluation Note  Tammy Higgins , a 27 y.o. female  was evaluated in triage.  Pt complains of chest pain and left breast pain x 3 days.  Notes that her chest pain is worse with inspiration.  Had a lumpectomy to the left breast and August 2023.  She did follow-up with her surgeon as scheduled following that x 2 appointments.  Has not had any issues since until 3 days ago.  Notes that she still has a suture that was dissolvable however is still present.  Notes that she has had crusting to the nipple.  Patient had 1 episode of emesis today.  Denies nausea, shortness of breath.  Denies past medical history of MI, stents, CAD.  Has a history of type 1 diabetes and hypertension.  Review of Systems  Positive:  Negative:   Physical Exam  BP 127/70 (BP Location: Right Arm)   Pulse (!) 104   Temp 98.7 F (37.1 C) (Oral)   Resp 16   Ht 5\' 3"  (1.6 m)   Wt 54.4 kg   SpO2 99%   BMI 21.26 kg/m  Gen:   Awake, no distress   Resp:  Normal effort  MSK:   Moves extremities without difficulty  Other:  Sternal left-sided chest wall tenderness to palpation.  Medical Decision Making  Medically screening exam initiated at 12:34 PM.  Appropriate orders placed.  Tammy Higgins was informed that the remainder of the evaluation will be completed by another provider, this initial triage assessment does not replace that evaluation, and the importance of remaining in the ED until their evaluation is complete.  Workup initiated   Tammy Higgins A, PA-C 12/11/22 1241

## 2022-12-11 NOTE — ED Triage Notes (Signed)
Reports had a lumpectomy in August and is having chest pain and left breast pain x 3 days.  Reports having episode of vomiting today.

## 2022-12-11 NOTE — Inpatient Diabetes Management (Signed)
Inpatient Diabetes Program Recommendations  AACE/ADA: New Consensus Statement on Inpatient Glycemic Control (2015)  Target Ranges:  Prepandial:   less than 140 mg/dL      Peak postprandial:   less than 180 mg/dL (1-2 hours)      Critically ill patients:  140 - 180 mg/dL   Lab Results  Component Value Date   GLUCAP 272 (H) 07/05/2022   HGBA1C 9.0 (H) 06/26/2022    Review of Glycemic Control  Diabetes history: Diabetes Type 1 (requires insulin for basal, bolus to correct glucose trends in addition to covering carbohydrate consumption) Outpatient Diabetes medications: Lantus 12 units qhs, Novolog 1-7 units target 130, sensitivity 50  Current orders for Inpatient glycemic control:  None  Inpatient Diabetes Program Recommendations:    -  Novolog 0-9 units Q4 hours while in the ED -  Consider Semglee 12 units.  Thanks,  Tama Headings RN, MSN, BC-ADM Inpatient Diabetes Coordinator Team Pager [phone] (8a-5p)

## 2022-12-11 NOTE — ED Notes (Signed)
Charge RN informed patient that she would need to wait for [place] results prior to discharge. Primary RN had also mentioned this to patient as well. Patient not currently visualized on the stretcher or in the department. Provider made aware.

## 2022-12-16 ENCOUNTER — Emergency Department (HOSPITAL_COMMUNITY): Payer: 59

## 2022-12-16 ENCOUNTER — Emergency Department (HOSPITAL_COMMUNITY)
Admission: EM | Admit: 2022-12-16 | Discharge: 2022-12-16 | Disposition: A | Discharge status: Home / Self Care | Payer: 59 | Attending: Emergency Medicine | Admitting: Emergency Medicine

## 2022-12-16 DIAGNOSIS — U071 COVID-19: Secondary | ICD-10-CM | POA: Diagnosis not present

## 2022-12-16 DIAGNOSIS — R000 Tachycardia, unspecified: Secondary | ICD-10-CM | POA: Diagnosis not present

## 2022-12-16 DIAGNOSIS — N61 Mastitis without abscess: Secondary | ICD-10-CM

## 2022-12-16 DIAGNOSIS — E109 Type 1 diabetes mellitus without complications: Secondary | ICD-10-CM | POA: Diagnosis not present

## 2022-12-16 DIAGNOSIS — N644 Mastodynia: Secondary | ICD-10-CM | POA: Diagnosis not present

## 2022-12-16 DIAGNOSIS — A419 Sepsis, unspecified organism: Secondary | ICD-10-CM | POA: Diagnosis not present

## 2022-12-16 LAB — CBC WITH DIFFERENTIAL/PLATELET
Abs Immature Granulocytes: 0.01 10*3/uL (ref 0.00–0.07)
Basophils Absolute: 0 10*3/uL (ref 0.0–0.1)
Basophils Relative: 0 %
Eosinophils Absolute: 0 10*3/uL (ref 0.0–0.5)
Eosinophils Relative: 1 %
HCT: 46.5 % — ABNORMAL HIGH (ref 36.0–46.0)
Hemoglobin: 16 g/dL — ABNORMAL HIGH (ref 12.0–15.0)
Immature Granulocytes: 0 %
Lymphocytes Relative: 4 %
Lymphs Abs: 0.2 10*3/uL — ABNORMAL LOW (ref 0.7–4.0)
MCH: 30.9 pg (ref 26.0–34.0)
MCHC: 34.4 g/dL (ref 30.0–36.0)
MCV: 89.8 fL (ref 80.0–100.0)
Monocytes Absolute: 0.3 10*3/uL (ref 0.1–1.0)
Monocytes Relative: 6 %
Neutro Abs: 5.1 10*3/uL (ref 1.7–7.7)
Neutrophils Relative %: 89 %
Platelets: 184 10*3/uL (ref 150–400)
RBC: 5.18 MIL/uL — ABNORMAL HIGH (ref 3.87–5.11)
RDW: 12.1 % (ref 11.5–15.5)
WBC: 5.7 10*3/uL (ref 4.0–10.5)
nRBC: 0 % (ref 0.0–0.2)

## 2022-12-16 LAB — COMPREHENSIVE METABOLIC PANEL
ALT: 19 U/L (ref 0–44)
AST: 24 U/L (ref 15–41)
Albumin: 5.1 g/dL — ABNORMAL HIGH (ref 3.5–5.0)
Alkaline Phosphatase: 86 U/L (ref 38–126)
Anion gap: 12 (ref 5–15)
BUN: 13 mg/dL (ref 6–20)
CO2: 23 mmol/L (ref 22–32)
Calcium: 9.5 mg/dL (ref 8.9–10.3)
Chloride: 100 mmol/L (ref 98–111)
Creatinine, Ser: 0.67 mg/dL (ref 0.44–1.00)
GFR, Estimated: >60 mL/min (ref >60)
Glucose, Bld: 239 mg/dL — ABNORMAL HIGH (ref 70–99)
Potassium: 3.7 mmol/L (ref 3.5–5.1)
Sodium: 135 mmol/L (ref 135–145)
Total Bilirubin: 0.5 mg/dL (ref 0.3–1.2)
Total Protein: 8.3 g/dL — ABNORMAL HIGH (ref 6.5–8.1)

## 2022-12-16 LAB — CULTURE, BLOOD (ROUTINE X 2)
Culture: NO GROWTH
Culture: NO GROWTH
Special Requests: ADEQUATE
Special Requests: ADEQUATE

## 2022-12-16 LAB — CBG MONITORING, ED: Glucose-Capillary: 253 mg/dL — ABNORMAL HIGH (ref 70–99)

## 2022-12-16 LAB — BLOOD GAS, VENOUS
Acid-base deficit: 0.2 mmol/L (ref 0.0–2.0)
Bicarbonate: 24 mmol/L (ref 20.0–28.0)
FIO2: 21 %
O2 Saturation: 78.3 %
Patient temperature: 41.1
pCO2, Ven: 44 mmHg (ref 44–60)
pH, Ven: 7.36 (ref 7.25–7.43)
pO2, Ven: 59 mmHg — ABNORMAL HIGH (ref 32–45)

## 2022-12-16 LAB — PROTIME-INR
INR: 0.9 (ref 0.8–1.2)
Prothrombin Time: 12.3 seconds (ref 11.4–15.2)

## 2022-12-16 LAB — RESP PANEL BY RT-PCR (RSV, FLU A&B, COVID)  RVPGX2
Influenza A by PCR: NEGATIVE
Influenza B by PCR: NEGATIVE
Resp Syncytial Virus by PCR: NEGATIVE
SARS Coronavirus 2 by RT PCR: POSITIVE — AB

## 2022-12-16 LAB — LACTIC ACID, PLASMA
Lactic Acid, Venous: 2.2 mmol/L (ref 0.5–1.9)
Lactic Acid, Venous: 1 mmol/L (ref 0.5–1.9)

## 2022-12-16 LAB — BETA-HYDROXYBUTYRIC ACID: Beta-Hydroxybutyric Acid: 0.54 mmol/L — ABNORMAL HIGH (ref 0.05–0.27)

## 2022-12-16 LAB — APTT: aPTT: 27 seconds (ref 24–36)

## 2022-12-16 MED ORDER — PIPERACILLIN-TAZOBACTAM 3.375 G IVPB
3.3750 g | Freq: Three times a day (TID) | INTRAVENOUS | Status: DC
  Administered 2022-12-16: 3.375 g via INTRAVENOUS
  Filled 2022-12-16 (×2): qty 50

## 2022-12-16 MED ORDER — ONDANSETRON 4 MG PO TBDP: 4.0000 mg | ORAL_TABLET | Freq: Three times a day (TID) | ORAL | 0 refills | Status: AC | PRN

## 2022-12-16 MED ORDER — ACETAMINOPHEN 325 MG PO TABS
650.0000 mg | ORAL_TABLET | Freq: Once | ORAL | Status: AC
  Administered 2022-12-16: 650 mg via ORAL
  Filled 2022-12-16: qty 2

## 2022-12-16 MED ORDER — LACTATED RINGERS IV BOLUS
1000.0000 mL | Freq: Once | INTRAVENOUS | Status: AC
  Administered 2022-12-16: 1000 mL via INTRAVENOUS

## 2022-12-16 MED ORDER — VANCOMYCIN HCL 1250 MG/250ML IV SOLN
1250.0000 mg | INTRAVENOUS | Status: DC
  Administered 2022-12-16: 1250 mg via INTRAVENOUS
  Filled 2022-12-16: qty 250

## 2022-12-16 MED ORDER — AMOXICILLIN-POT CLAVULANATE 875-125 MG PO TABS: 1.0000 | ORAL_TABLET | Freq: Two times a day (BID) | ORAL | 0 refills | Status: DC

## 2022-12-16 NOTE — Discharge Instructions (Signed)
You are being treated for a breast infection with antibiotics. Call your surgeon on Monday, 1/29 for close follow up.  Your fever appears to be due to COVID-19.  However if you notice worsening breast pain, swelling, vomiting, trouble breathing, or any other new/concerning symptoms then return to the ER for evaluation.

## 2022-12-16 NOTE — ED Triage Notes (Signed)
Pt to ED c/o emesis that started today, pt is a type 1 DM. Reports blood sugars running high today. Reports took home urine test that was positive for ketones. Also c/o bilat breast pain.

## 2022-12-16 NOTE — Progress Notes (Signed)
Pharmacy Antibiotic Note  Tammy Higgins is a 27 y.o. female admitted on 12/16/2022 with sepsis.  Pharmacy has been consulted for vancomycin and zosyn dosing.  Plan: Vancomycin 1250 mg IV x1, followed by vancomycin 1250 mg IV Q24h (eAUC 454, goal AUC 400-550, Scr 0.81, Vd 0.72) Zosyn 3.375g IV Q8h   Trend WBC, fever, renal function F/u cultures, clinical progress, levels as indicated De-escalate when able  Height: 5\' 3"  (160 cm) Weight: 51.3 kg (113 lb) IBW/kg (Calculated) : 52.4  Temp (24hrs), Avg:100.6 F (38.1 C), Min:100.6 F (38.1 C), Max:100.6 F (38.1 C)  Recent Labs  Lab 12/11/22 1248  WBC 9.0  CREATININE 0.81  LATICACIDVEN 0.9    Estimated Creatinine Clearance: 84.5 mL/min (by C-G formula based on SCr of 0.81 mg/dL).    No Known Allergies  Microbiology results: 1/27 BCx: pending    Thank you for allowing pharmacy to be a part of this patient's care.  Ardyth Harps, PharmD Clinical Pharmacist

## 2022-12-16 NOTE — ED Provider Notes (Signed)
[place] EMERGENCY DEPARTMENT AT [facility] Provider Note   CSN: [id number] Arrival date & time: 12/16/22  1805     History  Chief Complaint  Patient presents with   Emesis   Breast Pain    bilat    Tammy Higgins is a 27 y.o. female.  HPI 27 year old female with a type 1 diabetes presents with hyperglycemia and concern for DKA as well as breast pain.  She states that all day today she has had glucoses in the 300s.  She took a urine test at home that showed moderate to severe ketones.  She is also had some vomiting today and some transient abdominal pain that is now gone.  States she is on her menstrual cycle often gets pain and vomiting with that. Doesn't typically get breat pain. and Her chest hurts and both breasts hurt, left worse than right.  She had a previous lumpectomy by Dr. Dwain Sarna last year and over the last week or so her left has been hurting and she has had some drainage from her nipple.  She went to [facility] where she had an ultrasound and was told she had mastitis but she actually left before final disposition and did not receive any antibiotics.  She has not had a fever at home but has a 100.6 temperature here.  No cough, sore throat, urinary symptoms.  Home Medications Prior to Admission medications   Medication Sig Start Date End Date Taking? Authorizing Provider  amoxicillin-clavulanate (AUGMENTIN) 875-125 MG tablet Take 1 tablet by mouth every 12 (twelve) hours. 12/16/22  Yes Pricilla Loveless, MD  insulin aspart (NOVOLOG) 100 UNIT/ML injection Inject 1-7 Units into the skin 3 (three) times daily before meals. Blood sugar -130 and divided by 50 Correct if blood sugar is above 250   Yes [provider]  insulin glargine (LANTUS) 100 UNIT/ML injection Inject 0.15 mLs (15 Units total) into the skin at bedtime. Patient taking differently: Inject 10 Units into the skin at bedtime. 12/07/20  Yes Tat, Onalee Hua, MD  ondansetron (ZOFRAN-ODT) 4 MG  disintegrating tablet Take 1 tablet (4 mg total) by mouth every 8 (eight) hours as needed for nausea or vomiting. 12/16/22  Yes Pricilla Loveless, MD  tranexamic acid (LYSTEDA) 650 MG TABS tablet Take 1,300 mg by mouth See admin instructions. Take 1300 mg by mouth three times daily during menstrual cycle or during surgical procedures   Yes [provider]  ondansetron (ZOFRAN) 4 MG tablet Take 1 tablet (4 mg total) by mouth every 6 (six) hours. Patient not taking: Reported on 06/19/2022 03/01/21   Gilda Crease, MD      Allergies    Patient has no known allergies.    Review of Systems   Review of Systems  Constitutional:  Negative for fever.  Respiratory:  Negative for cough and shortness of breath.   Cardiovascular:  Positive for chest pain.  Gastrointestinal:  Positive for abdominal pain and vomiting.  Genitourinary:  Negative for dysuria.    Physical Exam Updated Vital Signs BP 114/71   Pulse (!) 102   Temp 99.1 F (37.3 C) (Oral)   Resp (!) 22   Ht 5\' 3"  (1.6 m)   Wt 51.3 kg   LMP 12/16/2022   SpO2 96%   BMI 20.02 kg/m  Physical Exam Vitals and nursing note reviewed. Exam conducted with a chaperone present.  Constitutional:      Appearance: She is well-developed. She is not diaphoretic.  HENT:  Head: Normocephalic and atraumatic.  Cardiovascular:     Rate and Rhythm: Regular rhythm. Tachycardia present.     Heart sounds: Normal heart sounds.  Pulmonary:     Effort: Pulmonary effort is normal.     Breath sounds: Normal breath sounds. No wheezing or rales.  Chest:  Breasts:    Right: Tenderness present.     Left: Tenderness present.    Abdominal:     General: There is no distension.     Palpations: Abdomen is soft.     Tenderness: There is no abdominal tenderness.  Skin:    General: Skin is warm and dry.  Neurological:     Mental Status: She is alert.     ED Results / Procedures / Treatments   Labs (all labs ordered are listed, but only  abnormal results are displayed) Labs Reviewed  RESP PANEL BY RT-PCR (RSV, FLU A&B, COVID)  RVPGX2 - Abnormal; Notable for the following components:      Result Value   SARS Coronavirus 2 by RT PCR POSITIVE (*)    All other components within normal limits  BLOOD GAS, VENOUS - Abnormal; Notable for the following components:   pO2, Ven 59 (*)    All other components within normal limits  BETA-HYDROXYBUTYRIC ACID - Abnormal; Notable for the following components:   Beta-Hydroxybutyric Acid 0.54 (*)    All other components within normal limits  LACTIC ACID, PLASMA - Abnormal; Notable for the following components:   Lactic Acid, Venous 2.2 (*)    All other components within normal limits  COMPREHENSIVE METABOLIC PANEL - Abnormal; Notable for the following components:   Glucose, Bld 239 (*)    Total Protein 8.3 (*)    Albumin 5.1 (*)    All other components within normal limits  CBC WITH DIFFERENTIAL/PLATELET - Abnormal; Notable for the following components:   RBC 5.18 (*)    Hemoglobin 16.0 (*)    HCT 46.5 (*)    Lymphs Abs 0.2 (*)    All other components within normal limits  CBG MONITORING, ED - Abnormal; Notable for the following components:   Glucose-Capillary 253 (*)    All other components within normal limits  CULTURE, BLOOD (ROUTINE X 2)  CULTURE, BLOOD (ROUTINE X 2)  LACTIC ACID, PLASMA  PROTIME-INR  APTT  URINALYSIS, W/ REFLEX TO CULTURE (INFECTION SUSPECTED)  POC URINE PREG, ED    EKG EKG Interpretation  Date/Time:  Saturday December 16 2022 19:43:25 EST Ventricular Rate:  108 PR Interval:  129 QRS Duration: 90 QT Interval:  328 QTC Calculation: 440 R Axis:   59 Text Interpretation: Sinus tachycardia Probable left atrial enlargement RSR' in V1 or V2, right VCD or RVH nonspecific T waves similar to Dec 11 2022 Confirmed by Sherwood Gambler [phone]) on 12/16/2022 8:12:36 PM  Radiology DG Chest Port 1 View  Result Date: 12/16/2022 CLINICAL DATA:  Sepsis. EXAM:  PORTABLE CHEST 1 VIEW COMPARISON:  December 11, 2022. FINDINGS: The heart size and mediastinal contours are within normal limits. Both lungs are clear. The visualized skeletal structures are unremarkable. IMPRESSION: No active disease. Electronically Signed   By: Marijo Conception M.D.   On: 12/16/2022 19:28    Procedures Procedures    Medications Ordered in ED Medications  vancomycin (VANCOREADY) IVPB 1250 mg/250 mL (0 mg Intravenous Stopped 12/16/22 2150)  piperacillin-tazobactam (ZOSYN) IVPB 3.375 g (3.375 g Intravenous New Bag/Given 12/16/22 2152)  lactated ringers bolus 1,000 mL (0 mLs Intravenous Stopped 12/16/22 2048)  acetaminophen (TYLENOL) tablet 650 mg (650 mg Oral Given 12/16/22 1917)    ED Course/ Medical Decision Making/ A&P                             Medical Decision Making Amount and/or Complexity of Data Reviewed Labs: ordered.    Details: Labs not consistent with DKA including normal pH, normal anion gap, normal bicarbonate.  She is hyperglycemic.  WBC normal.  Initial lactate mildly elevated but I suspect dehydration rather than sepsis.  COVID test is positive. Radiology: ordered and independent interpretation performed.    Details: No pneumonia on x-ray. ECG/medicine tests: ordered and independent interpretation performed.    Details: Tachycardia but no ischemia.  Risk OTC drugs. Prescription drug management.   Patient's breast exam shows some mild area of what is probably infection and some dried drainage on her nipple but does not seem consistent with something that would cause sepsis.  However she is having a low-grade fever here and significant tachycardia.  Fluids were given but her workup is not consistent with DKA.  However her COVID test is positive which I suspect is the actual reason for her fever and resultant tachycardia besides some dehydration.  Thus I think the lactate is unlikely to be from sepsis more likely dehydration and improved with fluids.  He is  still reasonable to cover with antibiotics given the presentation but I do not think she needs emergent admission, IV antibiotics, or surgery.  Was given IV of antibiotics initially though at this point with no clear sepsis I think she is stable for discharge home with supportive care and return precautions.  Encouraged her to follow-up closely with her surgeon.        Final Clinical Impression(s) / ED Diagnoses Final diagnoses:  COVID-19  Mastitis    Rx / DC Orders ED Discharge Orders          Ordered    amoxicillin-clavulanate (AUGMENTIN) 875-125 MG tablet  Every 12 hours        12/16/22 2256    ondansetron (ZOFRAN-ODT) 4 MG disintegrating tablet  Every 8 hours PRN        12/16/22 2256              Sherwood Gambler, MD 12/16/22 2318

## 2022-12-21 LAB — CULTURE, BLOOD (ROUTINE X 2)
Culture: NO GROWTH
Culture: NO GROWTH
Special Requests: ADEQUATE
Special Requests: ADEQUATE

## 2023-01-29 IMAGING — US US BREAST*L* LIMITED INC AXILLA
1 series · 11 of 11 positions shown · non-contrast
Comparison: None.

CLINICAL DATA: 25-year-old female with a palpable area of concern
in the left breast. This is been present the past several years, and
although fluctuates in size it has overall increased in size from
when it was first discovered.

EXAM:
ULTRASOUND OF THE LEFT BREAST

[Series 1: us breast*left* limited inc axilla · 0.07mm/px · 11 of 11 slices shown]
[im 1/11]
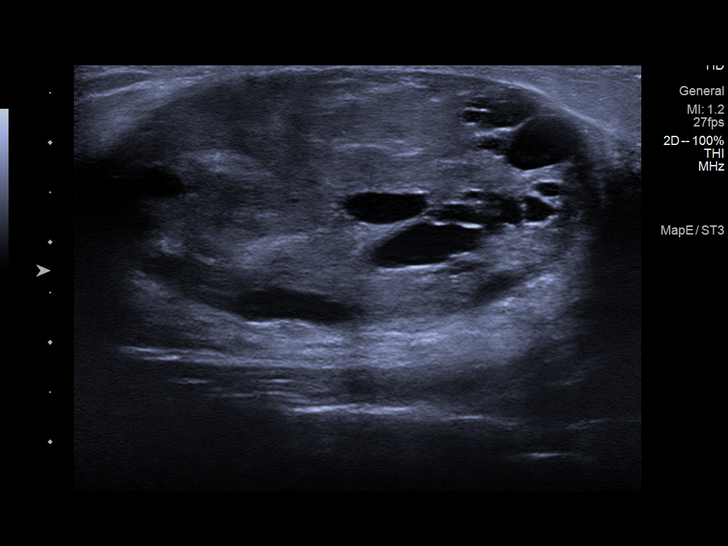
[im 2/11]
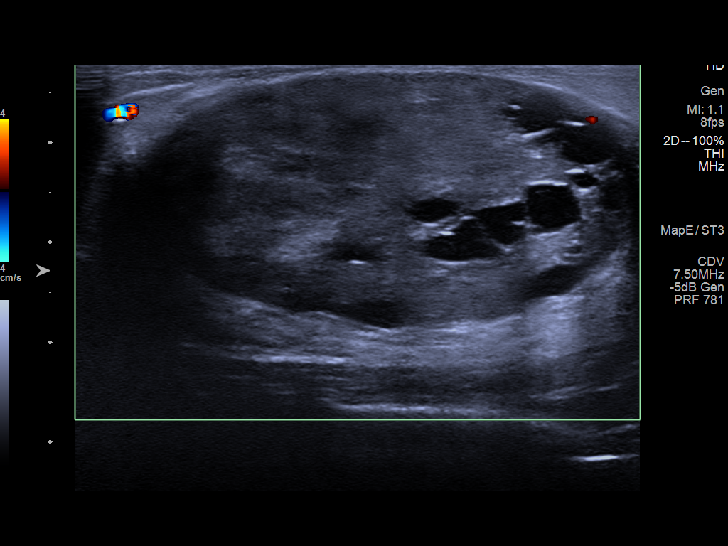
[im 3/11]
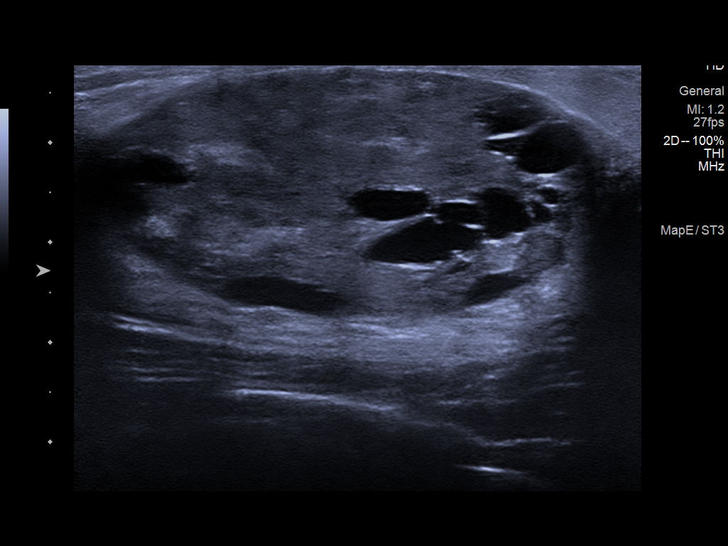
[im 4/11]
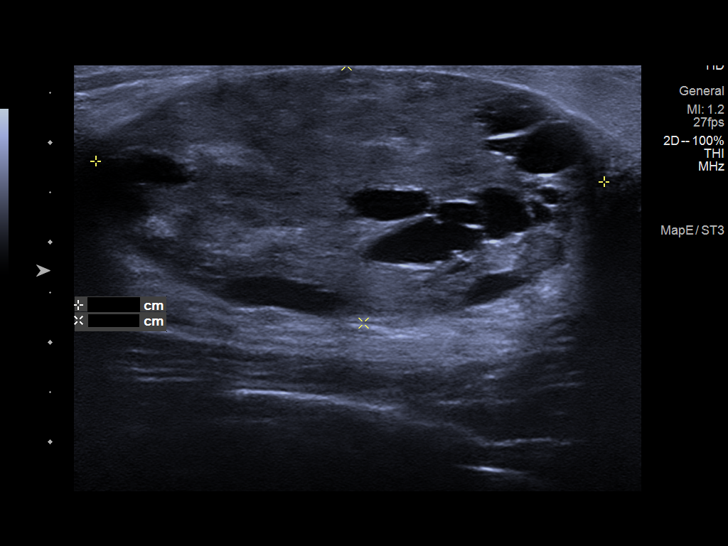
[im 5/11]
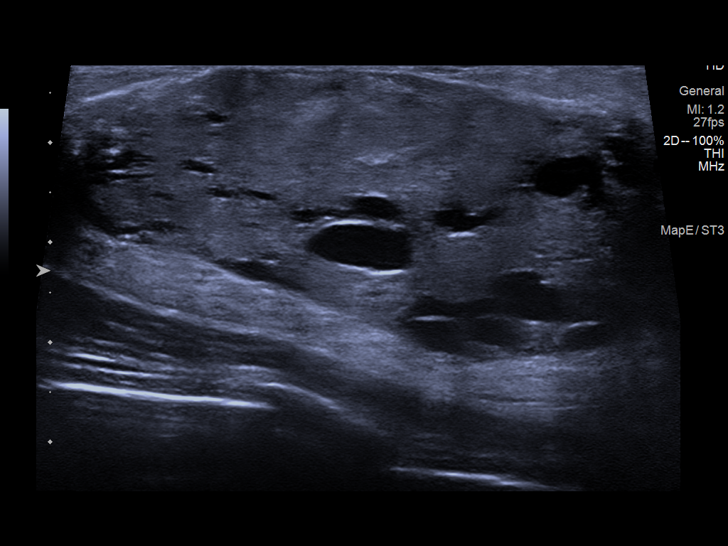
[im 6/11]
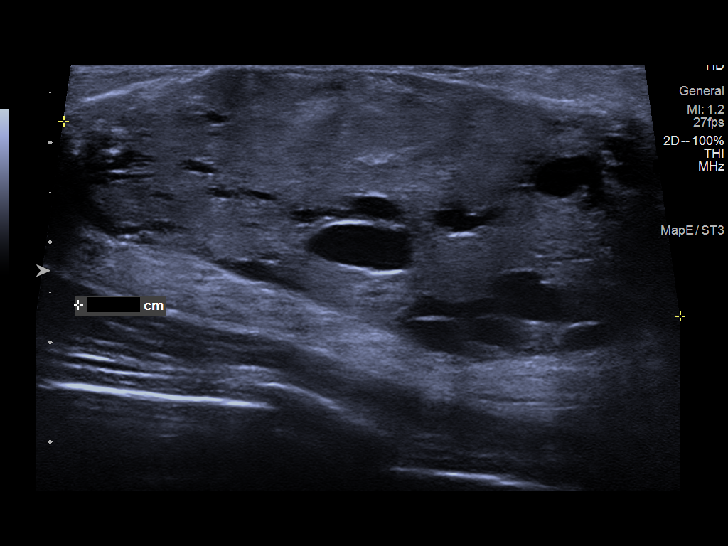
[im 7/11]
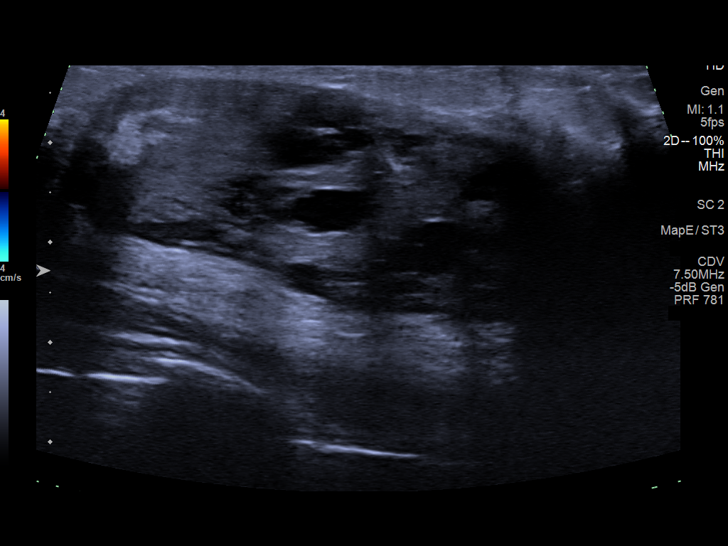
[im 8/11]
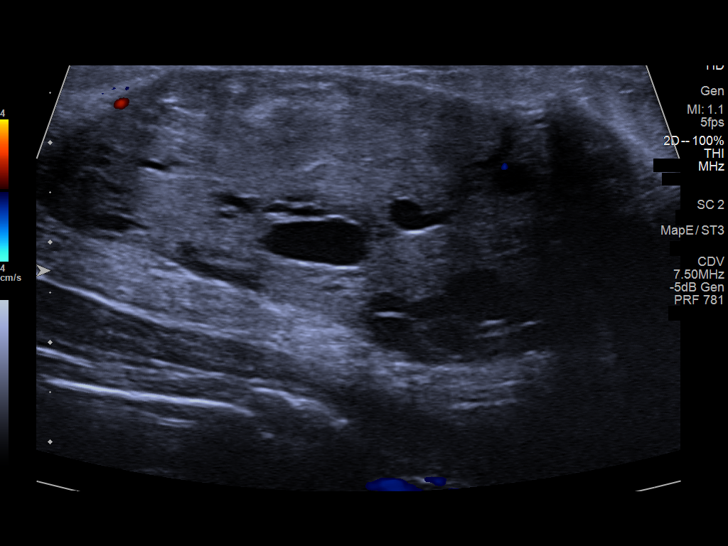
[im 9/11]
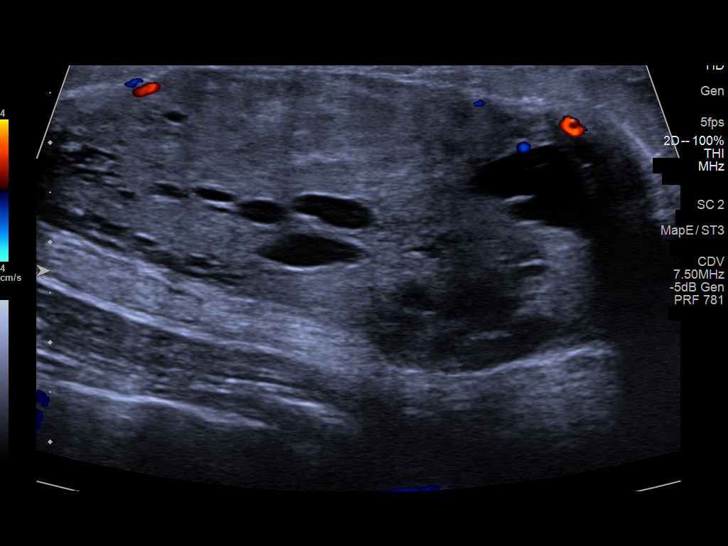
[im 10/11]
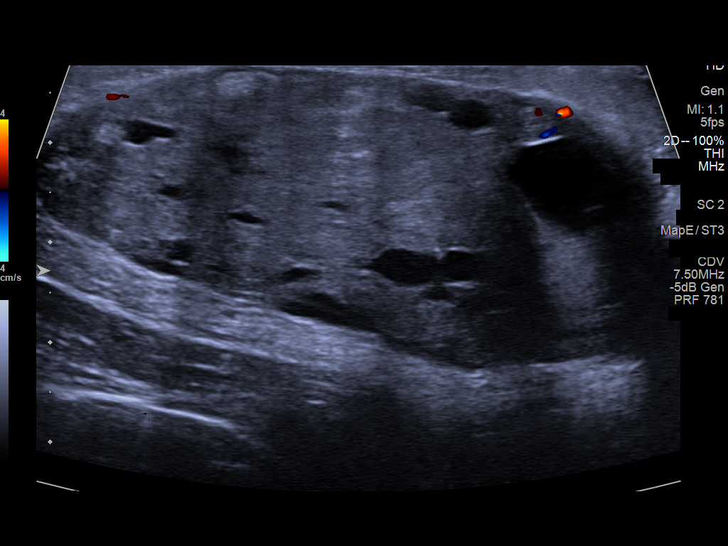
[im 11/11]
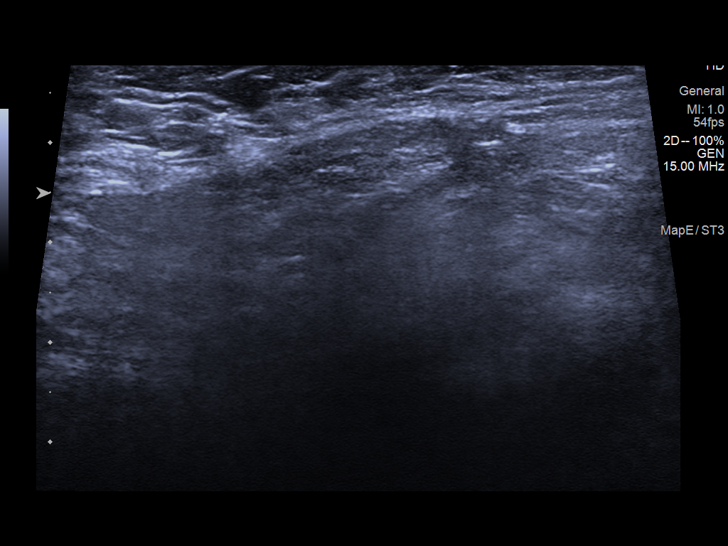

[11 of 11 positions shown; findings below may reference images not displayed]

FINDINGS: Physical examination reveals a firm large slightly mobile mass
involving the superior left breast.

Targeted ultrasound of the left breast was performed. There is an
oval well-circumscribed hypoechoic mass with internal cystic spaces
and echogenic components 12 o'clock 2 cm from nipple. This measures
6.5 x 2.6 x 5.1 cm. No lymphadenopathy seen in the left axilla.
IMPRESSION: Indeterminate 6.5 cm palpable mass in the left breast demonstrating
imaging features most suggestive of a fibroadenoma, however given
large size tissue sampling is warranted to rule out phyllodes tumor.

RECOMMENDATION:
Recommend ultrasound-guided core biopsy of the mass in the left
breast at the 12 o'clock position.

I have discussed the findings and recommendations with the patient.
If applicable, a reminder letter will be sent to the patient
regarding the next appointment.

BI-RADS CATEGORY  4: Suspicious.

## 2023-02-19 IMAGING — US US BREAST BX W LOC DEV 1ST LESION IMG BX SPEC US GUIDE*L*
1 series · 12 of 14 positions shown · non-contrast
Comparison: Previous exam(s).
COMPARISON: Previous exam(s).

Addendum:
CLINICAL DATA: 25-year-old female with an indeterminate left breast
mass.

EXAM:
ULTRASOUND GUIDED LEFT BREAST CORE NEEDLE BIOPSY

[Series 1: us breast bx w loc dev 1st lesion img bx spec us g · 0.07mm/px · 12 of 14 slices shown]
[im 1/14]
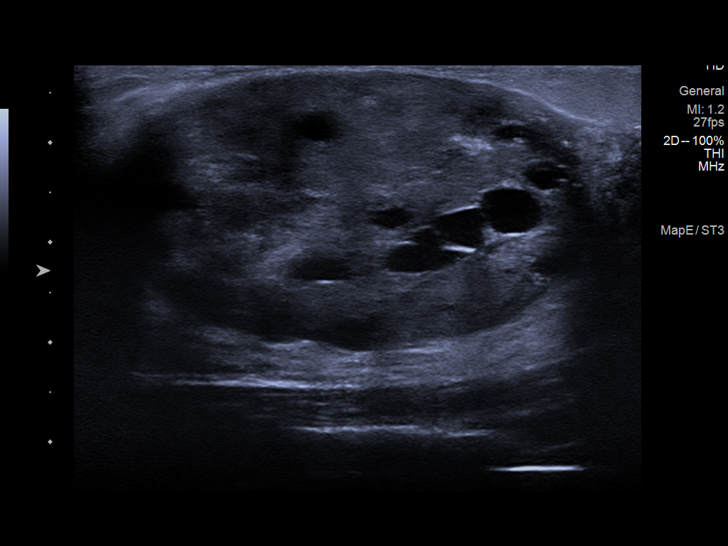
[im 2/14]
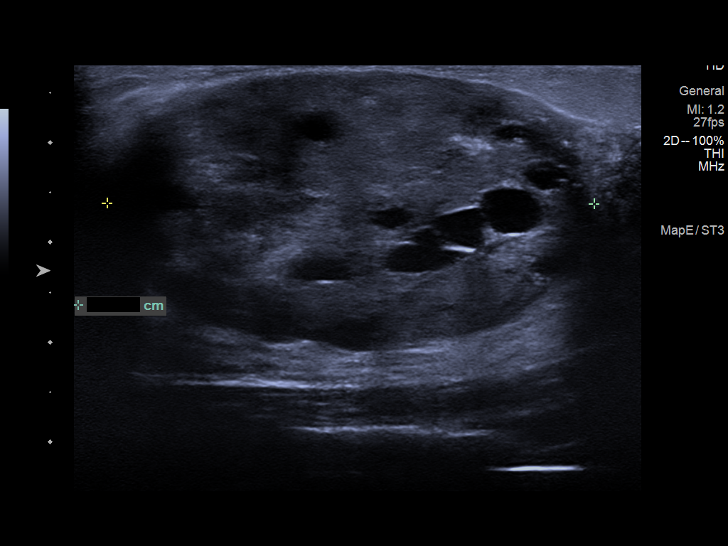
[im 3/14]
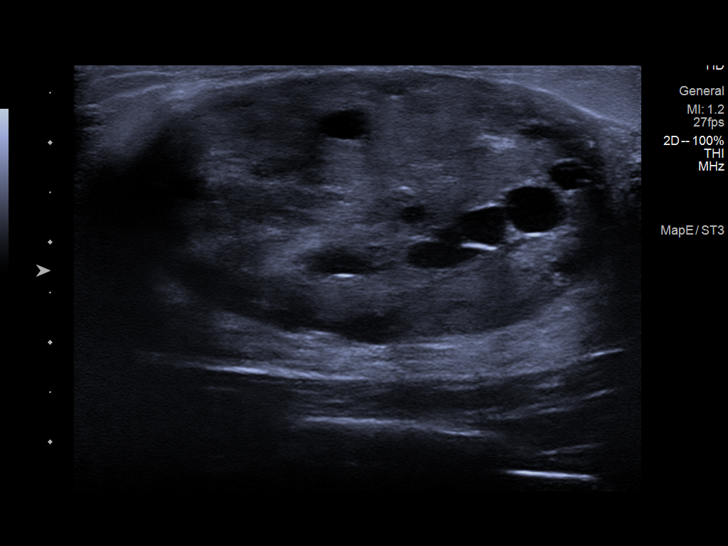
[im 5/14]
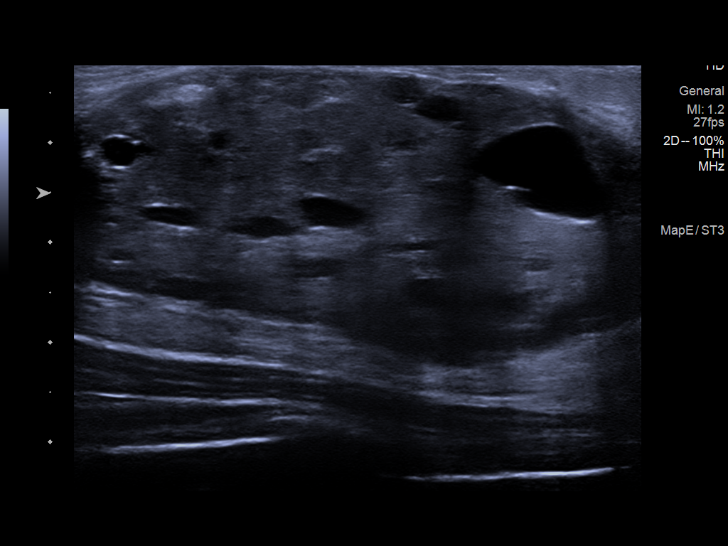
[im 6/14]
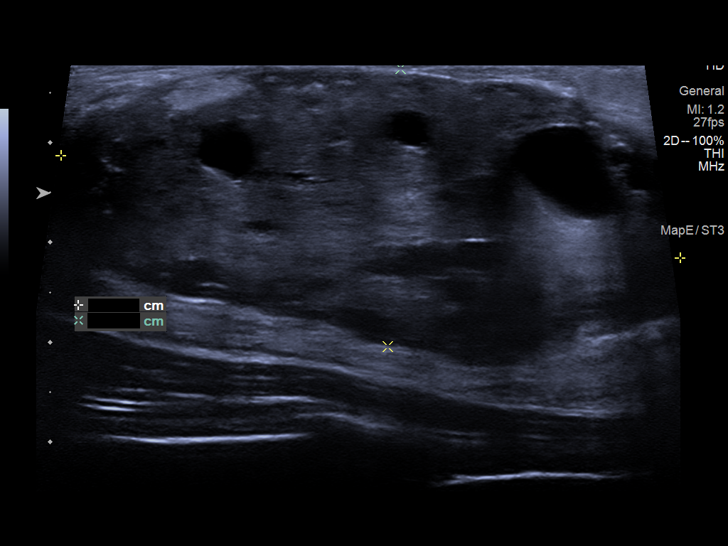
[im 7/14]
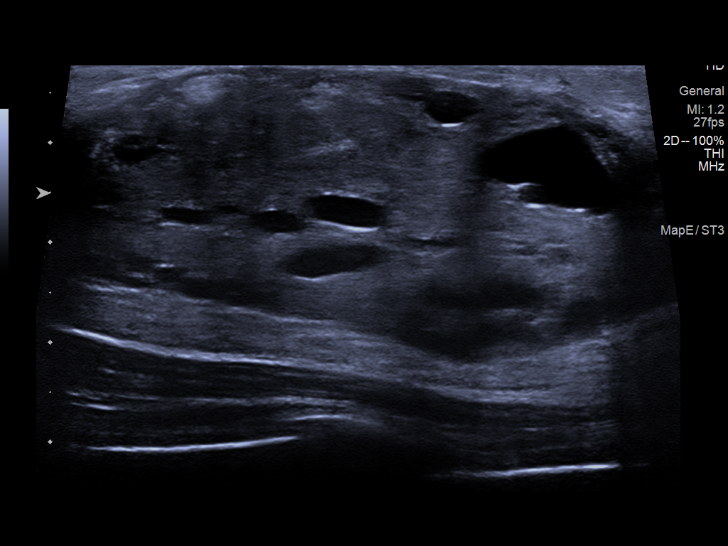
[im 8/14]
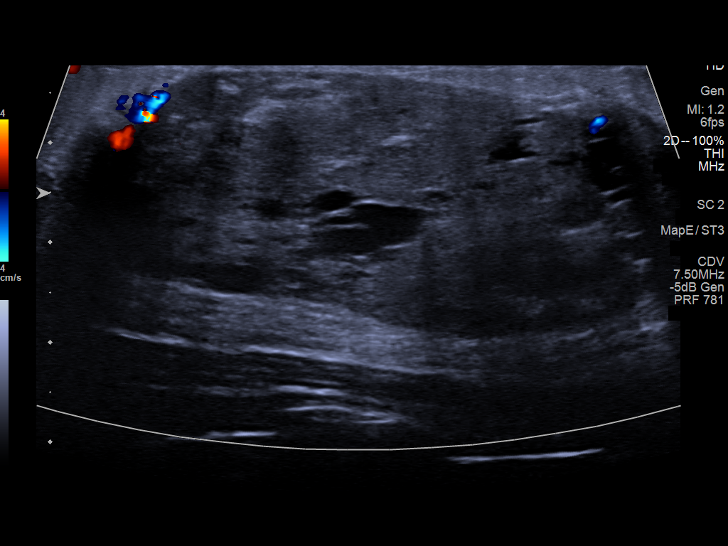
[im 9/14]
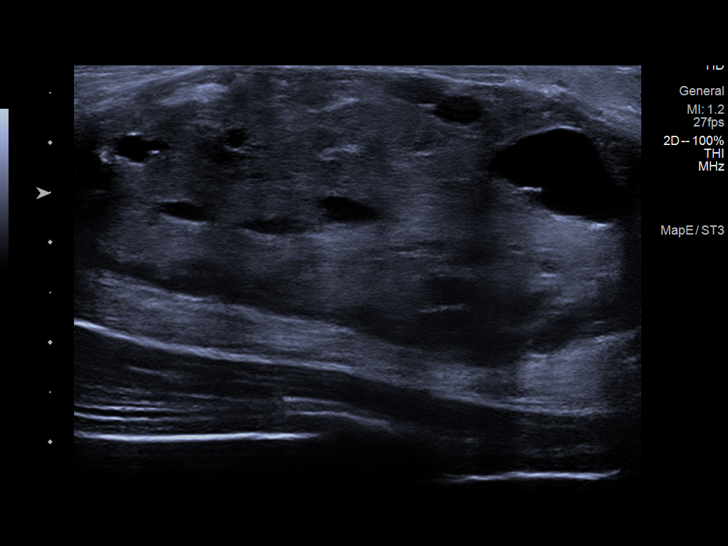
[im 10/14]
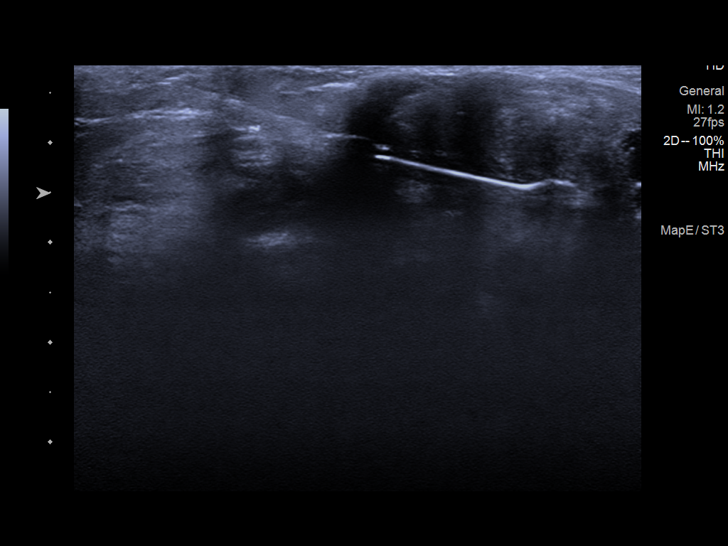
[im 12/14]
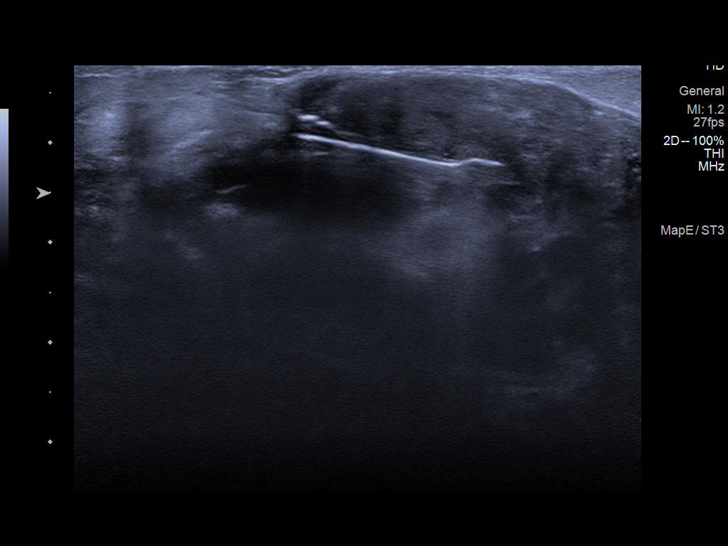
[im 13/14]
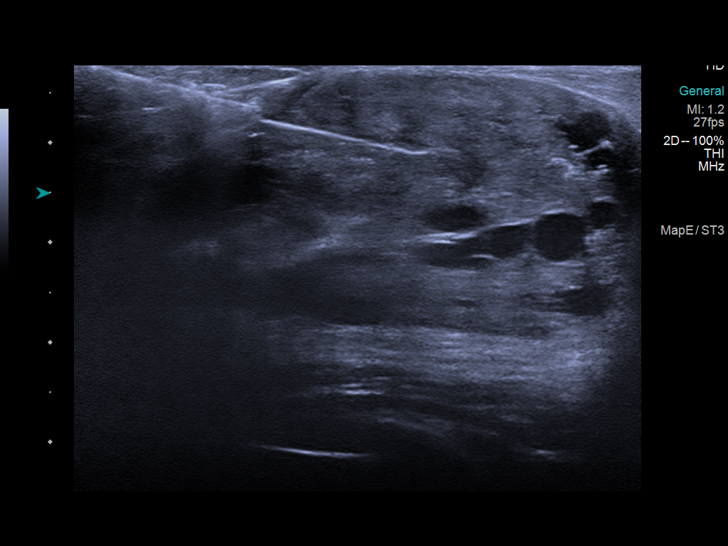
[im 14/14]
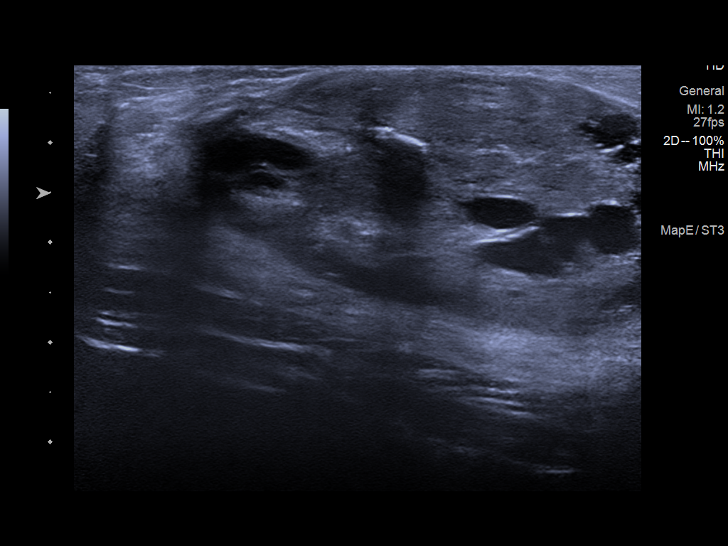

[12 of 14 positions shown; findings below may reference images not displayed]



Lesion quadrant: Upper inner quadrant

Using sterile technique and 1% Lidocaine as local anesthetic, under
direct ultrasound visualization, a 14 gauge Daphnee device was
used to perform biopsy of a mass at the 12 o'clock position using a
medial approach. At the conclusion of the procedure a HydroMARK
tissue marker clip was deployed into the biopsy cavity. Follow up 2
view mammogram was deferred at this time.
IMPRESSION: Ultrasound guided biopsy of the left breast. No apparent
complications.

ADDENDUM:
PATHOLOGY revealed: A. BREAST, [DATE], LEFT, BIOPSY: -
Pseudoangiomatous stromal hyperplasia. No atypia or malignancy.

Pathology results are DISCORDANT with imaging findings, per Dr.
Amnon Tiger with excision recommended. Given the size, the patient
requested surgical excision for symptomatic/aesthetic purposes.

Pathology results and recommendations were discussed with patient
via telephone on 01/12/2022. Patient reported biopsy site doing well
with no adverse symptoms, and only slight tenderness at the site.
Post biopsy care instructions were reviewed, questions were answered
and my direct phone number was provided. Patient was instructed to
call [HOSPITAL] [HOSPITAL] Mammography Department for any
additional questions or concerns related to biopsy site.

RECOMMENDATION: Surgical consultation due to large size of biopsied
lesion. Request for surgical consultation was relayed to Sophie
Cristo RT at [HOSPITAL] [HOSPITAL] Mammography Department by
Nivirus Databex RN on 01/12/2022. Note: Patient also requests surgical
excision for symptomatic/aesthetic purposes.

Pathology results reported by Nivirus Databex RN on 01/13/2022.



Lesion quadrant: Upper inner quadrant

Using sterile technique and 1% Lidocaine as local anesthetic, under
direct ultrasound visualization, a 14 gauge Daphnee device was
used to perform biopsy of a mass at the 12 o'clock position using a
medial approach. At the conclusion of the procedure a HydroMARK
tissue marker clip was deployed into the biopsy cavity. Follow up 2
view mammogram was deferred at this time.
IMPRESSION: Ultrasound guided biopsy of the left breast. No apparent
complications.

## 2024-08-26 ENCOUNTER — Emergency Department (HOSPITAL_COMMUNITY)
Admission: EM | Admit: 2024-08-26 | Discharge: 2024-08-26 | Disposition: A | Discharge status: Home / Self Care | Attending: Emergency Medicine | Admitting: Emergency Medicine

## 2024-08-26 ENCOUNTER — Other Ambulatory Visit: Payer: Self-pay

## 2024-08-26 ENCOUNTER — Encounter (HOSPITAL_COMMUNITY): Payer: Self-pay | Admitting: Emergency Medicine

## 2024-08-26 DIAGNOSIS — E1165 Type 2 diabetes mellitus with hyperglycemia: Secondary | ICD-10-CM | POA: Diagnosis not present

## 2024-08-26 DIAGNOSIS — R739 Hyperglycemia, unspecified: Secondary | ICD-10-CM | POA: Diagnosis present

## 2024-08-26 DIAGNOSIS — Z794 Long term (current) use of insulin: Secondary | ICD-10-CM | POA: Diagnosis not present

## 2024-08-26 LAB — COMPREHENSIVE METABOLIC PANEL WITH GFR
ALT: 15 U/L (ref 0–44)
AST: 17 U/L (ref 15–41)
Albumin: 4.6 g/dL (ref 3.5–5.0)
Alkaline Phosphatase: 98 U/L (ref 38–126)
Anion gap: 15 (ref 5–15)
BUN: 13 mg/dL (ref 6–20)
CO2: 21 mmol/L — ABNORMAL LOW (ref 22–32)
Calcium: 9.7 mg/dL (ref 8.9–10.3)
Chloride: 94 mmol/L — ABNORMAL LOW (ref 98–111)
Creatinine, Ser: 0.75 mg/dL (ref 0.44–1.00)
GFR, Estimated: >60 mL/min (ref >60)
Glucose, Bld: 640 mg/dL (ref 70–99)
Potassium: 4.4 mmol/L (ref 3.5–5.1)
Sodium: 130 mmol/L — ABNORMAL LOW (ref 135–145)
Total Bilirubin: 0.4 mg/dL (ref 0.0–1.2)
Total Protein: 7.2 g/dL (ref 6.5–8.1)

## 2024-08-26 LAB — BLOOD GAS, VENOUS
Acid-Base Excess: 1.5 mmol/L (ref 0.0–2.0)
Bicarbonate: 27.2 mmol/L (ref 20.0–28.0)
Drawn by: 4237
O2 Saturation: 57 %
Patient temperature: 36.6
pCO2, Ven: 45 mmHg (ref 44–60)
pH, Ven: 7.39 (ref 7.25–7.43)
pO2, Ven: 32 mmHg (ref 32–45)

## 2024-08-26 LAB — URINALYSIS, COMPLETE (UACMP) WITH MICROSCOPIC
Bacteria, UA: NONE SEEN
Bilirubin Urine: NEGATIVE
Glucose, UA: >=500 mg/dL — AB
Hgb urine dipstick: NEGATIVE
Ketones, ur: 5 mg/dL — AB
Leukocytes,Ua: NEGATIVE
Nitrite: NEGATIVE
Protein, ur: NEGATIVE mg/dL
Specific Gravity, Urine: 1.024 (ref 1.005–1.030)
pH: 5 (ref 5.0–8.0)

## 2024-08-26 LAB — BASIC METABOLIC PANEL WITH GFR
Anion gap: 13 (ref 5–15)
BUN: 10 mg/dL (ref 6–20)
CO2: 21 mmol/L — ABNORMAL LOW (ref 22–32)
Calcium: 9.1 mg/dL (ref 8.9–10.3)
Chloride: 103 mmol/L (ref 98–111)
Creatinine, Ser: 0.64 mg/dL (ref 0.44–1.00)
GFR, Estimated: >60 mL/min (ref >60)
Glucose, Bld: 200 mg/dL — ABNORMAL HIGH (ref 70–99)
Potassium: 4.1 mmol/L (ref 3.5–5.1)
Sodium: 138 mmol/L (ref 135–145)

## 2024-08-26 LAB — I-STAT CHEM 8, ED
BUN: 13 mg/dL (ref 6–20)
Calcium, Ion: 1.18 mmol/L (ref 1.15–1.40)
Chloride: 97 mmol/L — ABNORMAL LOW (ref 98–111)
Creatinine, Ser: 0.7 mg/dL (ref 0.44–1.00)
Glucose, Bld: 645 mg/dL (ref 70–99)
HCT: 49 % — ABNORMAL HIGH (ref 36.0–46.0)
Hemoglobin: 16.7 g/dL — ABNORMAL HIGH (ref 12.0–15.0)
Potassium: 4.4 mmol/L (ref 3.5–5.1)
Sodium: 132 mmol/L — ABNORMAL LOW (ref 135–145)
TCO2: 22 mmol/L (ref 22–32)

## 2024-08-26 LAB — BETA-HYDROXYBUTYRIC ACID: Beta-Hydroxybutyric Acid: 1.29 mmol/L — ABNORMAL HIGH (ref 0.05–0.27)

## 2024-08-26 LAB — CBC WITH DIFFERENTIAL/PLATELET
Abs Immature Granulocytes: 0.02 K/uL (ref 0.00–0.07)
Basophils Absolute: 0.1 K/uL (ref 0.0–0.1)
Basophils Relative: 1 %
Eosinophils Absolute: 0.1 K/uL (ref 0.0–0.5)
Eosinophils Relative: 2 %
HCT: 45.7 % (ref 36.0–46.0)
Hemoglobin: 16 g/dL — ABNORMAL HIGH (ref 12.0–15.0)
Immature Granulocytes: 0 %
Lymphocytes Relative: 31 %
Lymphs Abs: 1.6 K/uL (ref 0.7–4.0)
MCH: 30.9 pg (ref 26.0–34.0)
MCHC: 35 g/dL (ref 30.0–36.0)
MCV: 88.4 fL (ref 80.0–100.0)
Monocytes Absolute: 0.4 K/uL (ref 0.1–1.0)
Monocytes Relative: 8 %
Neutro Abs: 3.1 K/uL (ref 1.7–7.7)
Neutrophils Relative %: 58 %
Platelets: 245 K/uL (ref 150–400)
RBC: 5.17 MIL/uL — ABNORMAL HIGH (ref 3.87–5.11)
RDW: 11.9 % (ref 11.5–15.5)
WBC: 5.3 K/uL (ref 4.0–10.5)
nRBC: 0 % (ref 0.0–0.2)

## 2024-08-26 LAB — CBG MONITORING, ED
Glucose-Capillary: >600 mg/dL (ref 70–99)
Glucose-Capillary: 473 mg/dL — ABNORMAL HIGH (ref 70–99)
Glucose-Capillary: 302 mg/dL — ABNORMAL HIGH (ref 70–99)
Glucose-Capillary: 217 mg/dL — ABNORMAL HIGH (ref 70–99)
Glucose-Capillary: 183 mg/dL — ABNORMAL HIGH (ref 70–99)

## 2024-08-26 LAB — POC URINE PREG, ED: Preg Test, Ur: NEGATIVE

## 2024-08-26 MED ORDER — INSULIN REGULAR(HUMAN) IN NACL 100-0.9 UT/100ML-% IV SOLN
INTRAVENOUS | Status: DC
  Administered 2024-08-26: 8.5 [IU]/h via INTRAVENOUS
  Filled 2024-08-26: qty 100

## 2024-08-26 MED ORDER — SODIUM CHLORIDE 0.9 % IV BOLUS
1000.0000 mL | Freq: Once | INTRAVENOUS | Status: AC
  Administered 2024-08-26: 1000 mL via INTRAVENOUS

## 2024-08-26 MED ORDER — LACTATED RINGERS IV SOLN: INTRAVENOUS | Status: DC

## 2024-08-26 MED ORDER — LACTATED RINGERS IV BOLUS
20.0000 mL/kg | Freq: Once | INTRAVENOUS | Status: AC
  Administered 2024-08-26: 1116 mL via INTRAVENOUS

## 2024-08-26 MED ORDER — DEXTROSE 50 % IV SOLN: 0.0000 mL | INTRAVENOUS | Status: DC | PRN

## 2024-08-26 MED ORDER — POTASSIUM CHLORIDE 10 MEQ/100ML IV SOLN
10.0000 meq | INTRAVENOUS | Status: AC
  Administered 2024-08-26 (×2): 10 meq via INTRAVENOUS
  Filled 2024-08-26 (×2): qty 100

## 2024-08-26 MED ORDER — DEXTROSE IN LACTATED RINGERS 5 % IV SOLN: INTRAVENOUS | Status: DC

## 2024-08-26 NOTE — Discharge Instructions (Signed)
 Follow-up with your doctor next week for recheck

## 2024-08-26 NOTE — ED Triage Notes (Signed)
 Pt bib pov w/ c/o high blood sugar. Pt has a hx of T1D. Pt reports that her dexcom reads CBG is in the 40's but her meter reads in the 400's. Pt reports she has tried juice and bread. Pt reports she feels like her sugar is high. She reports blurry vision, thirst, and dizziness.

## 2024-08-26 NOTE — ED Provider Notes (Signed)
 [facility] Provider Note   CSN: [id number] Arrival date & time: 08/26/24  9273     Patient presents with: Hyperglycemia   Tammy Higgins is a 28 y.o. female.  {Add pertinent medical, surgical, social history, OB history to YEP:67052} Patient has a history of diabetes.  She was getting readings from her Dexcom and that showed her sugars were 40.  She kept eating more food.  She then checked her sugars on her fingerstick and instead 500.  So the Dexcom was incorrect.   Hyperglycemia      Prior to Admission medications   Medication Sig Start Date End Date Taking? Authorizing Provider  acetaminophen  (TYLENOL ) 500 MG tablet Take 1,000 mg by mouth every 6 (six) hours as needed for moderate pain (pain score 4-6). 03/02/21  Yes [provider]  BAQSIMI ONE PACK 3 MG/DOSE POWD Place 3 mg into the nose once. As directed 07/07/24  Yes [provider]  Continuous Glucose Sensor (DEXCOM G7 SENSOR) MISC REPLACE SENSOR EVERY 10 DAYS   Yes [provider]  insulin  glargine (LANTUS ) 100 UNIT/ML injection Inject 0.15 mLs (15 Units total) into the skin at bedtime. Patient taking differently: Inject 10 Units into the skin at bedtime. 12/07/20  Yes Tat, Alm, MD  insulin  lispro (HUMALOG) 100 UNIT/ML KwikPen Inject 20 Units into the skin 3 (three) times daily. With meals using the   Yes [provider]  ondansetron  (ZOFRAN ) 4 MG tablet Take 1 tablet (4 mg total) by mouth every 6 (six) hours. 03/01/21  Yes Pollina, Lonni PARAS, MD  tranexamic acid (LYSTEDA) 650 MG TABS tablet Take 1,300 mg by mouth See admin instructions. Take 1300 mg by mouth three times daily during menstrual cycle or during surgical procedures   Yes [provider]  ondansetron  (ZOFRAN -ODT) 4 MG disintegrating tablet Take 1 tablet (4 mg total) by mouth every 8 (eight) hours as needed for nausea or vomiting. 12/16/22   Freddi Hamilton, MD     Allergies: Patient has no known allergies.    Review of Systems  Updated Vital Signs BP 115/68   Pulse 91   Temp 97.9 F (36.6 C) (Oral)   Resp 14   Ht 5' 3 (1.6 m)   Wt 55.8 kg   LMP 08/19/2024 (Exact Date)   SpO2 97%   BMI 21.79 kg/m   Physical Exam  (all labs ordered are listed, but only abnormal results are displayed) Labs Reviewed  CBC WITH DIFFERENTIAL/PLATELET - Abnormal; Notable for the following components:      Result Value   RBC 5.17 (*)    Hemoglobin 16.0 (*)    All other components within normal limits  COMPREHENSIVE METABOLIC PANEL WITH GFR - Abnormal; Notable for the following components:   Sodium 130 (*)    Chloride 94 (*)    CO2 21 (*)    Glucose, Bld 640 (*)    All other components within normal limits  URINALYSIS, COMPLETE (UACMP) WITH MICROSCOPIC - Abnormal; Notable for the following components:   Color, Urine COLORLESS (*)    Glucose, UA >=500 (*)    Ketones, ur 5 (*)    All other components within normal limits  BASIC METABOLIC PANEL WITH GFR - Abnormal; Notable for the following components:   CO2 21 (*)    Glucose, Bld 200 (*)    All other components within normal limits  CBG MONITORING, ED - Abnormal; Notable for the following components:   Glucose-Capillary >600 (*)  All other components within normal limits  I-STAT CHEM 8, ED - Abnormal; Notable for the following components:   Sodium 132 (*)    Chloride 97 (*)    Glucose, Bld 645 (*)    Hemoglobin 16.7 (*)    HCT 49.0 (*)    All other components within normal limits  CBG MONITORING, ED - Abnormal; Notable for the following components:   Glucose-Capillary 473 (*)    All other components within normal limits  CBG MONITORING, ED - Abnormal; Notable for the following components:   Glucose-Capillary 302 (*)    All other components within normal limits  CBG MONITORING, ED - Abnormal; Notable for the following components:   Glucose-Capillary 217 (*)    All other components within  normal limits  CBG MONITORING, ED - Abnormal; Notable for the following components:   Glucose-Capillary 183 (*)    All other components within normal limits  POC URINE PREG, ED - Normal  BLOOD GAS, VENOUS  BETA-HYDROXYBUTYRIC ACID  BASIC METABOLIC PANEL WITH GFR  BASIC METABOLIC PANEL WITH GFR  BASIC METABOLIC PANEL WITH GFR    EKG: None  Radiology: No results found.  {Document cardiac monitor, telemetry assessment procedure when appropriate:32947} Procedures   Medications Ordered in the ED  insulin  regular, human (MYXREDLIN ) 100 units/ 100 mL infusion (2.2 Units/hr Intravenous Rate/Dose Change 08/26/24 1134)  lactated ringers  infusion (0 mLs Intravenous Stopped 08/26/24 1137)  dextrose  5 % in lactated ringers  infusion ( Intravenous New Bag/Given 08/26/24 1134)  dextrose  50 % solution 0-50 mL (has no administration in time range)  sodium chloride  0.9 % bolus 1,000 mL (0 mLs Intravenous Stopped 08/26/24 0919)  lactated ringers  bolus 1,116 mL (1,116 mLs Intravenous Bolus from Bag 08/26/24 0920)  potassium chloride  10 mEq in 100 mL IVPB (0 mEq Intravenous Stopped 08/26/24 1135)   CRITICAL CARE Performed by: Fairy Sermon Total critical care time: 40 minutes Critical care time was exclusive of separately billable procedures and treating other patients. Critical care was necessary to treat or prevent imminent or life-threatening deterioration. Critical care was time spent personally by me on the following activities: development of treatment plan with patient and/or surrogate as well as nursing, discussions with consultants, evaluation of patient's response to treatment, examination of patient, obtaining history from patient or surrogate, ordering and performing treatments and interventions, ordering and review of laboratory studies, ordering and review of radiographic studies, pulse oximetry and re-evaluation of patient's condition.   Patient with hyperglycemia and early DKA.  Patient was  put on insulin  drip and given fluids and her acidosis was corrected. {Click here for ABCD2, HEART and other calculators REFRESH Note before signing:1}                              Medical Decision Making Amount and/or Complexity of Data Reviewed Labs: ordered.  Risk Prescription drug management.   Hyperglycemia.  Patient is discharged home to monitor on glucose and to use strips to check her sugar instead of the Dexcom  {Document critical care time when appropriate  Document review of labs and clinical decision tools ie [id number], etc  Document your independent review of radiology images and any outside records  Document your discussion with family members, caretakers and with consultants  Document social determinants of health affecting pt's care  Document your decision making why or why not admission, treatments were needed:32947:::1}   Final diagnoses:  Hyperglycemia    ED Discharge  Orders     None
